# Patient Record
Sex: Female | Born: 1950 | Race: Black or African American | Hispanic: No | Marital: Married | Smoking: Former smoker
Health system: Southern US, Community
[De-identification: ages and names within clinical notes are randomized; demographics above are authoritative.]

## PROBLEM LIST (undated history)

## (undated) DIAGNOSIS — K219 Gastro-esophageal reflux disease without esophagitis: Secondary | ICD-10-CM

## (undated) DIAGNOSIS — H269 Unspecified cataract: Secondary | ICD-10-CM

## (undated) DIAGNOSIS — E559 Vitamin D deficiency, unspecified: Secondary | ICD-10-CM

## (undated) DIAGNOSIS — L439 Lichen planus, unspecified: Secondary | ICD-10-CM

## (undated) DIAGNOSIS — D126 Benign neoplasm of colon, unspecified: Secondary | ICD-10-CM

## (undated) DIAGNOSIS — M1990 Unspecified osteoarthritis, unspecified site: Secondary | ICD-10-CM

## (undated) DIAGNOSIS — J45909 Unspecified asthma, uncomplicated: Secondary | ICD-10-CM

## (undated) HISTORY — PX: COLONOSCOPY: SHX174

## (undated) HISTORY — PX: BREAST EXCISIONAL BIOPSY: SUR124

## (undated) HISTORY — DX: Unspecified cataract: H26.9

## (undated) HISTORY — DX: Lichen planus, unspecified: L43.9

## (undated) HISTORY — DX: Unspecified osteoarthritis, unspecified site: M19.90

## (undated) HISTORY — PX: POLYPECTOMY: SHX149

## (undated) HISTORY — DX: Vitamin D deficiency, unspecified: E55.9

## (undated) HISTORY — DX: Benign neoplasm of colon, unspecified: D12.6

## (undated) HISTORY — DX: Gastro-esophageal reflux disease without esophagitis: K21.9

## (undated) HISTORY — DX: Unspecified asthma, uncomplicated: J45.909

## (undated) HISTORY — PX: UPPER GASTROINTESTINAL ENDOSCOPY: SHX188

---

## 1992-01-15 HISTORY — PX: TUBAL LIGATION: SHX77

## 1998-06-08 ENCOUNTER — Other Ambulatory Visit: Admission: RE | Admit: 1998-06-08 | Discharge: 1998-06-08 | Payer: Self-pay | Admitting: Obstetrics

## 1999-01-15 HISTORY — PX: BREAST BIOPSY: SHX20

## 1999-02-26 ENCOUNTER — Encounter: Admission: RE | Admit: 1999-02-26 | Discharge: 1999-02-26 | Payer: Self-pay | Admitting: Obstetrics

## 1999-02-26 ENCOUNTER — Encounter: Payer: Self-pay | Admitting: Obstetrics

## 2000-07-08 ENCOUNTER — Encounter: Admission: RE | Admit: 2000-07-08 | Discharge: 2000-07-08 | Payer: Self-pay | Admitting: Obstetrics

## 2000-07-08 ENCOUNTER — Encounter: Payer: Self-pay | Admitting: Obstetrics

## 2000-07-14 ENCOUNTER — Encounter: Admission: RE | Admit: 2000-07-14 | Discharge: 2000-07-14 | Payer: Self-pay | Admitting: Obstetrics

## 2000-07-14 ENCOUNTER — Encounter: Payer: Self-pay | Admitting: Obstetrics

## 2003-03-23 ENCOUNTER — Ambulatory Visit (HOSPITAL_COMMUNITY): Admission: RE | Admit: 2003-03-23 | Discharge: 2003-03-23 | Payer: Self-pay | Admitting: Gastroenterology

## 2003-04-12 ENCOUNTER — Encounter: Admission: RE | Admit: 2003-04-12 | Discharge: 2003-04-12 | Payer: Self-pay | Admitting: Emergency Medicine

## 2003-04-26 ENCOUNTER — Encounter (INDEPENDENT_AMBULATORY_CARE_PROVIDER_SITE_OTHER): Payer: Self-pay | Admitting: *Deleted

## 2003-04-26 ENCOUNTER — Encounter: Admission: RE | Admit: 2003-04-26 | Discharge: 2003-04-26 | Payer: Self-pay | Admitting: Emergency Medicine

## 2003-05-23 ENCOUNTER — Ambulatory Visit (HOSPITAL_BASED_OUTPATIENT_CLINIC_OR_DEPARTMENT_OTHER): Admission: RE | Admit: 2003-05-23 | Discharge: 2003-05-23 | Payer: Self-pay | Admitting: General Surgery

## 2003-05-23 ENCOUNTER — Encounter: Admission: RE | Admit: 2003-05-23 | Discharge: 2003-05-23 | Payer: Self-pay | Admitting: General Surgery

## 2003-05-23 ENCOUNTER — Ambulatory Visit (HOSPITAL_COMMUNITY): Admission: RE | Admit: 2003-05-23 | Discharge: 2003-05-23 | Payer: Self-pay | Admitting: General Surgery

## 2003-05-23 ENCOUNTER — Encounter (INDEPENDENT_AMBULATORY_CARE_PROVIDER_SITE_OTHER): Payer: Self-pay | Admitting: *Deleted

## 2007-08-13 ENCOUNTER — Encounter: Admission: RE | Admit: 2007-08-13 | Discharge: 2007-08-13 | Payer: Self-pay | Admitting: Emergency Medicine

## 2007-08-14 ENCOUNTER — Encounter: Admission: RE | Admit: 2007-08-14 | Discharge: 2007-09-10 | Payer: Self-pay | Admitting: Emergency Medicine

## 2007-08-24 ENCOUNTER — Encounter: Admission: RE | Admit: 2007-08-24 | Discharge: 2007-08-24 | Payer: Self-pay | Admitting: Emergency Medicine

## 2007-09-18 ENCOUNTER — Encounter (HOSPITAL_BASED_OUTPATIENT_CLINIC_OR_DEPARTMENT_OTHER): Payer: Self-pay | Admitting: General Surgery

## 2007-09-18 ENCOUNTER — Ambulatory Visit (HOSPITAL_BASED_OUTPATIENT_CLINIC_OR_DEPARTMENT_OTHER): Admission: RE | Admit: 2007-09-18 | Discharge: 2007-09-18 | Payer: Self-pay | Admitting: General Surgery

## 2008-08-16 ENCOUNTER — Encounter: Admission: RE | Admit: 2008-08-16 | Discharge: 2008-08-16 | Payer: Self-pay | Admitting: Emergency Medicine

## 2008-09-15 ENCOUNTER — Encounter: Admission: RE | Admit: 2008-09-15 | Discharge: 2008-09-15 | Payer: Self-pay | Admitting: Emergency Medicine

## 2009-01-14 HISTORY — PX: BUNIONECTOMY: SHX129

## 2009-09-26 ENCOUNTER — Encounter: Admission: RE | Admit: 2009-09-26 | Discharge: 2009-09-26 | Payer: Self-pay | Admitting: Emergency Medicine

## 2010-01-14 HISTORY — PX: BUNIONECTOMY: SHX129

## 2010-05-29 NOTE — Op Note (Signed)
NAMELEENAH, SEIDNER NO.:  [id number]   MEDICAL RECORD NO.:  [id number]          PATIENT TYPE:  AMB   LOCATION:  DSC                          FACILITY:  [facility]   PHYSICIAN:  Leonie Man, M.D.   DATE OF BIRTH:  09-29-1950   DATE OF PROCEDURE:  09/18/2007  DATE OF DISCHARGE:                               OPERATIVE REPORT   PREOPERATIVE DIAGNOSIS:  Mass, left medial leg at the knee.   POSTOPERATIVE DIAGNOSIS:  Mass, left medial leg at the knee.   PATHOLOGY:  Pending.   PROCEDURE:  Excision of mass, left medial leg at the knee.   SURGEON:  Leonie Man, MD   ASSISTANT:  OR technician.   ANESTHESIA:  General.   SPECIMENS TO LABORATORY:  Mass, left leg forwarded to Pathology.   ESTIMATED BLOOD LOSS:  Minimal.   COMPLICATIONS:  None.   DISPOSITION:  The patient returned the PACU in excellent condition.   INDICATIONS:  The patient is a 60 year old woman with a rather tender 2-  cm mass in the medial aspect of her left leg just below the knee.  This  mass has not been increasing in size, but is becoming increasingly  painful for the patient.  Just touching the mass will trigger  significant pain within the mass.  She comes to the operating room now  for excision of this mass on general anesthesia.   PROCEDURE:  The patient was positioned supine and following the  induction of satisfactory general anesthesia, the left leg was prepped  and draped to be included in the sterile operative field.  I infiltrated  the region around the mass with 0.5% Marcaine with epinephrine.  I made  an elliptical incision around the mass deepening this through skin into  the subcutaneous tissue and dissected the subcutaneous mass from the  surrounding tissues in its entirety, removed it and forwarded it for  pathologic evaluation.  Hemostasis was assured with electrocautery.  The  subcutaneous tissues were reapproximated with 3-0 Vicryl.  Skin was  closed with 4-0 Monocryl  and reinforced with Steri-Strips.  Sterile  dressings were applied.  The anesthetic was reversed.  The patient was  moved from the operating room to the recovery room in stable condition.  She tolerated the procedure well.      Leonie Man, M.D.  Electronically Signed     PB/MEDQ  D:  09/18/2007  T:  09/18/2007  Job:  [id number]

## 2010-10-14 IMAGING — US US TRANSVAGINAL NON-OB
1 series · 14 of 25 positions shown · non-contrast
Comparison: None

CLINICAL DATA: Abnormal Pap smear

TRANSABDOMINAL AND TRANSVAGINAL ULTRASOUND OF PELVIS
TECHNIQUE: Both transabdominal and transvaginal ultrasound
examinations of the pelvis were performed including evaluation of
the uterus, ovaries, adnexal regions, and pelvic cul-de-sac.

[Series 1: us transvaginal non-ob · 0.25mm/px · 14 of 48 slices shown]
[im 1/48]
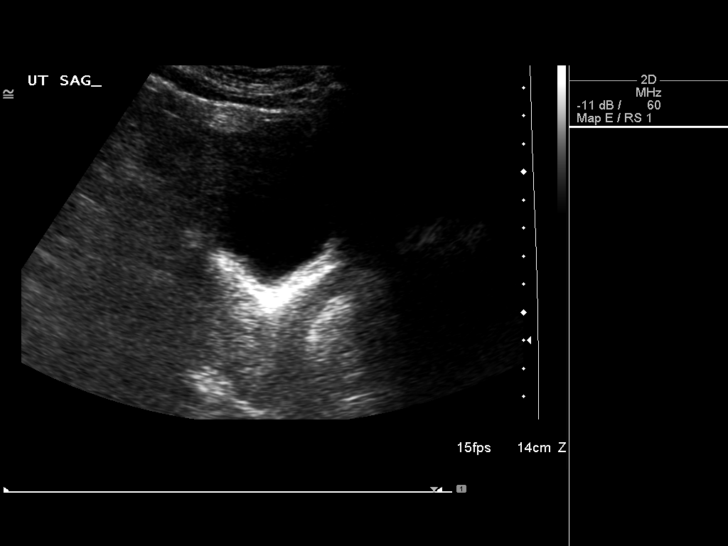
[im 4/48]
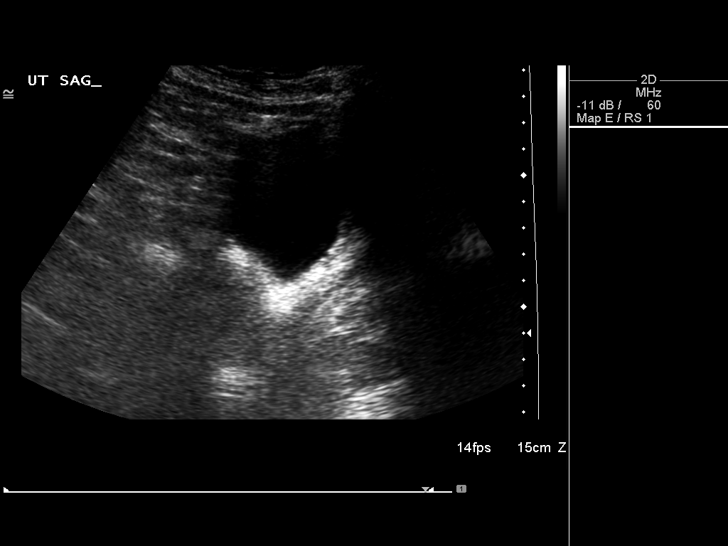
[im 8/48]
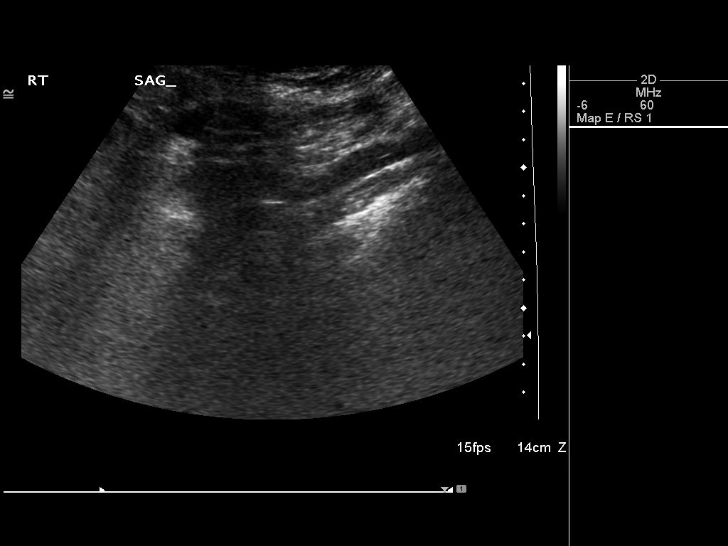
[im 12/48]
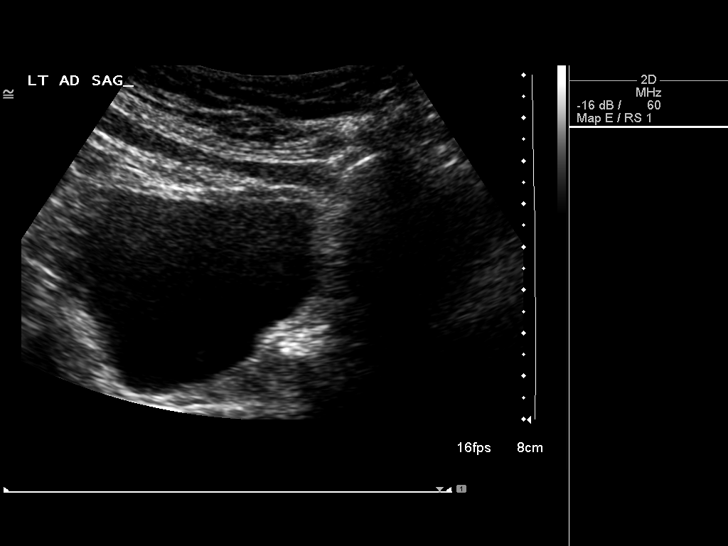
[im 16/48]
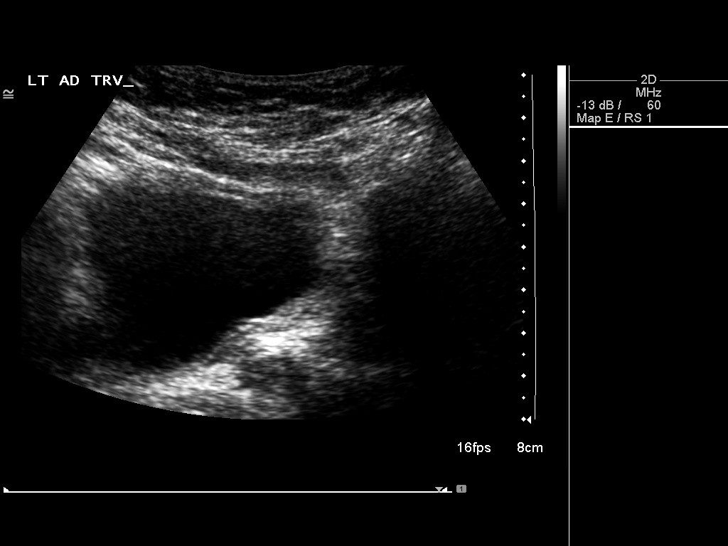
[im 18/48]
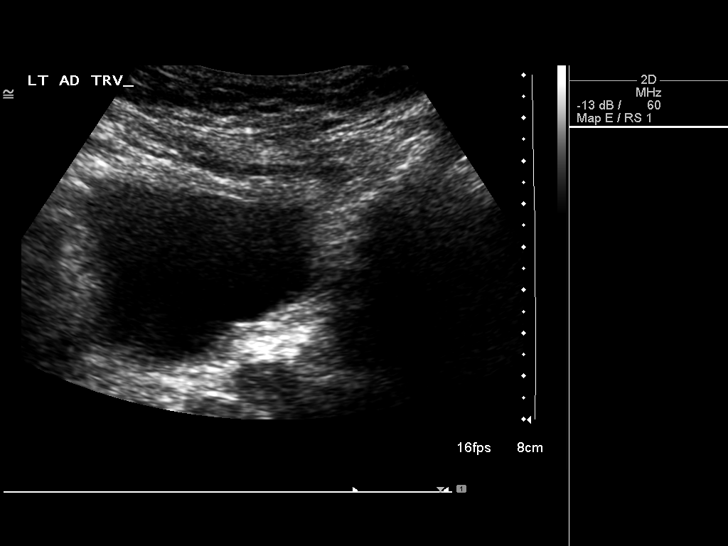
[im 22/48]
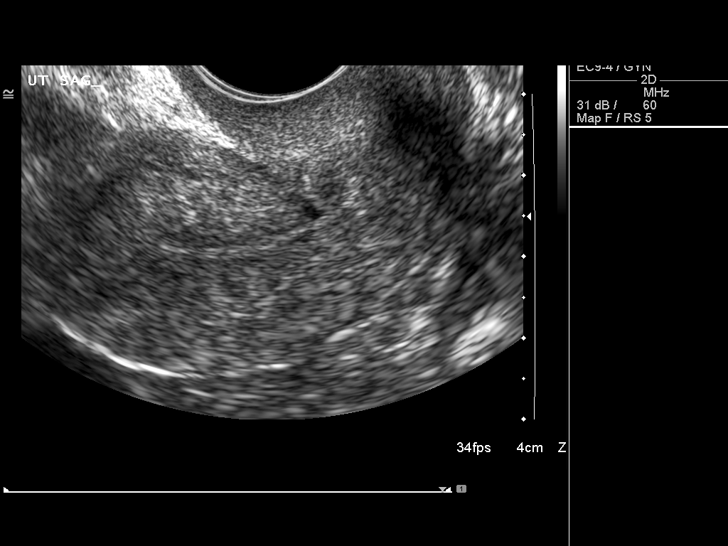
[im 26/48]
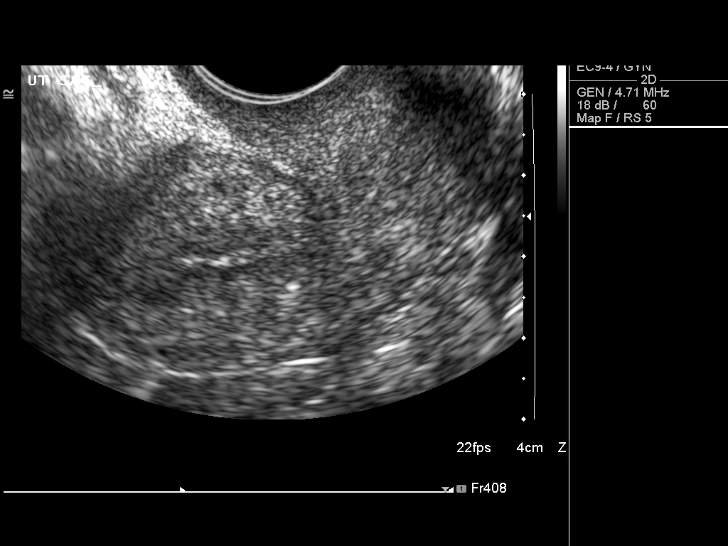
[im 30/48]
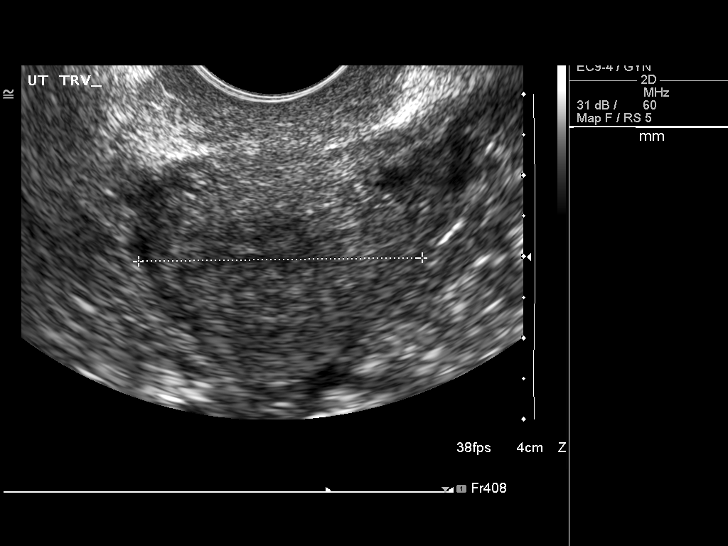
[im 32/48]
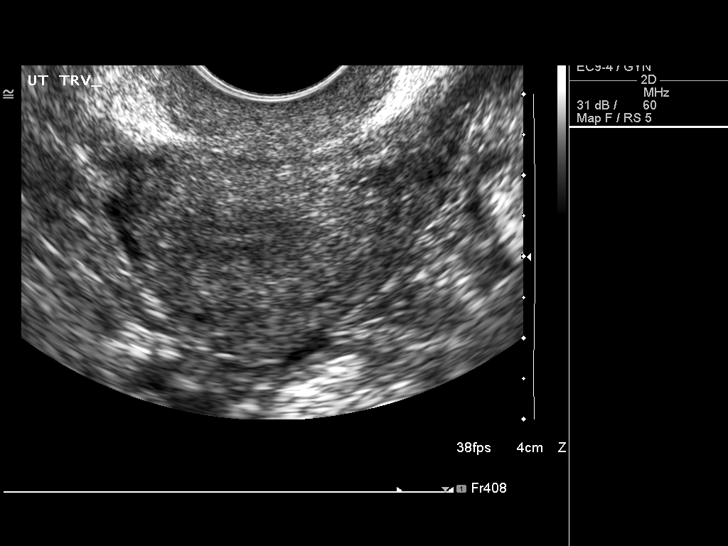
[im 36/48]
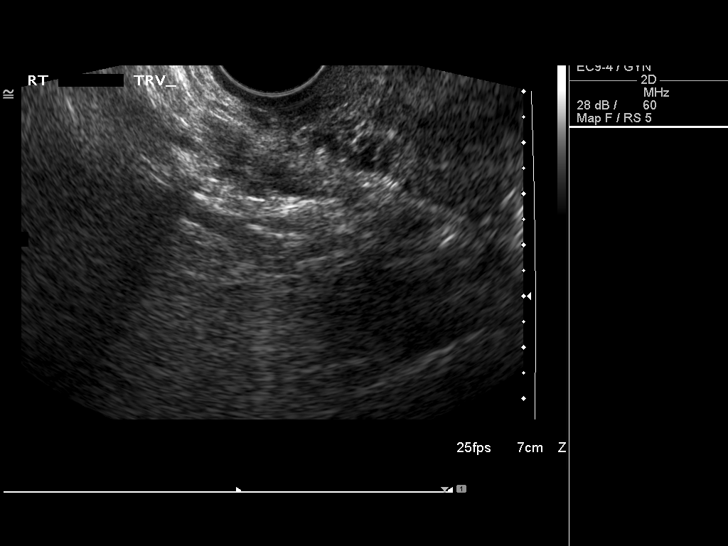
[im 40/48]
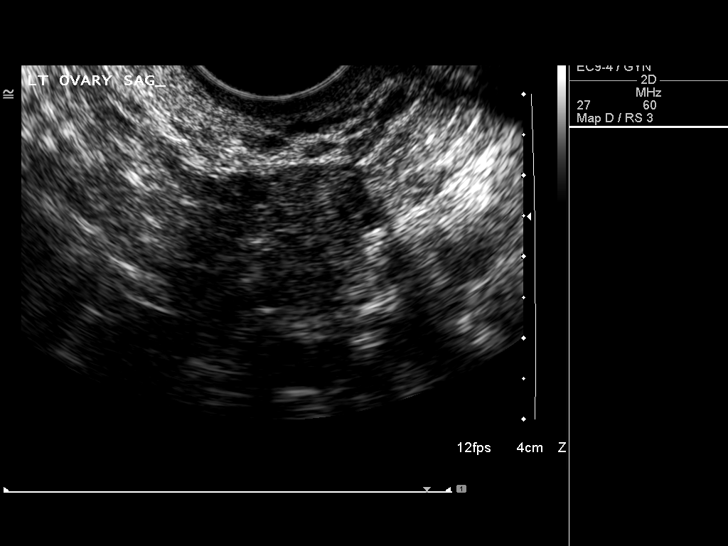
[im 44/48]
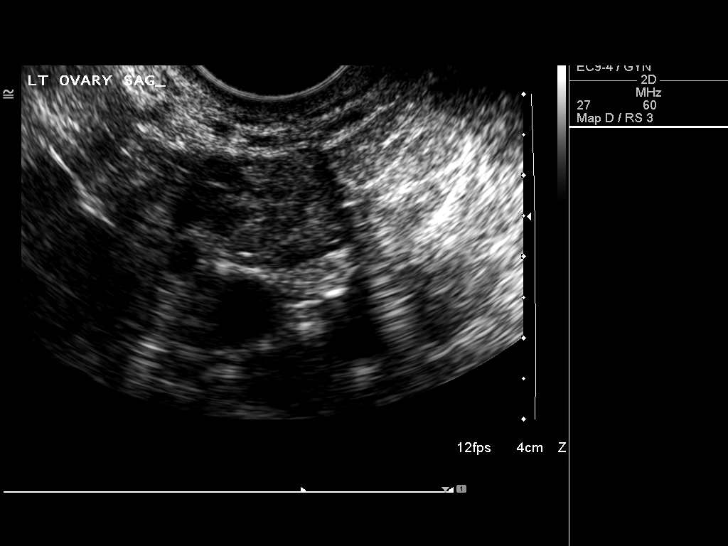
[im 48/48]
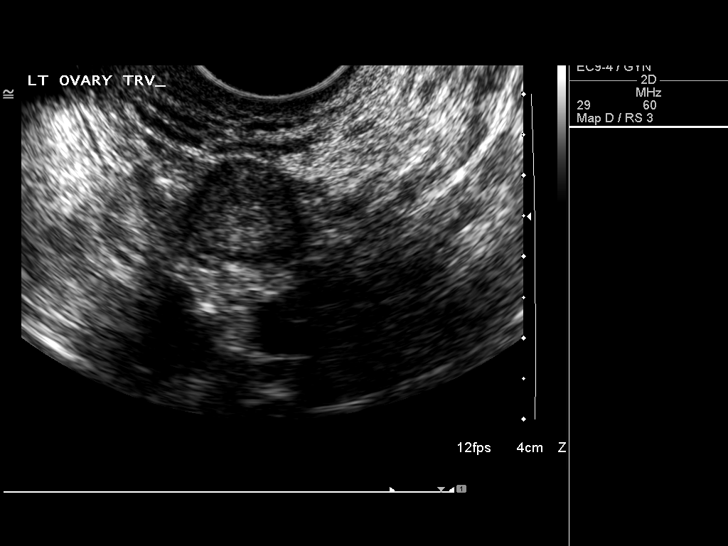

[14 of 25 positions shown; findings below may reference images not displayed]

FINDINGS: Uterus measures 5.5 x 3.1 x 3.5 cm.  No myometrial abnormality is
seen.

Endometrium measures 1.2 mm in thickness.  No abnormality is noted.

Right Ovary is not visualized.

Left Ovary measures 2.5 x 1.7 x 2.1 cm.

Other Findings:  No free fluid is seen.
IMPRESSION: Negative pelvic ultrasound.  The right ovary however is not
visualized.

## 2010-10-17 LAB — POCT HEMOGLOBIN-HEMACUE: Hemoglobin: 11.5 — ABNORMAL LOW

## 2011-01-18 ENCOUNTER — Other Ambulatory Visit: Payer: Self-pay | Admitting: Emergency Medicine

## 2011-01-18 DIAGNOSIS — Z1231 Encounter for screening mammogram for malignant neoplasm of breast: Secondary | ICD-10-CM

## 2011-02-04 ENCOUNTER — Ambulatory Visit
Admission: RE | Admit: 2011-02-04 | Discharge: 2011-02-04 | Disposition: A | Discharge status: Home / Self Care | Payer: BC Managed Care – PPO | Source: Ambulatory Visit | Attending: Emergency Medicine | Admitting: Emergency Medicine

## 2011-02-04 DIAGNOSIS — Z1231 Encounter for screening mammogram for malignant neoplasm of breast: Secondary | ICD-10-CM

## 2011-03-18 ENCOUNTER — Other Ambulatory Visit: Payer: Self-pay | Admitting: Family Medicine

## 2011-03-18 ENCOUNTER — Ambulatory Visit
Admission: RE | Admit: 2011-03-18 | Discharge: 2011-03-18 | Disposition: A | Discharge status: Home / Self Care | Payer: BC Managed Care – PPO | Source: Ambulatory Visit | Attending: Family Medicine | Admitting: Family Medicine

## 2011-03-18 DIAGNOSIS — R05 Cough: Secondary | ICD-10-CM

## 2011-05-29 ENCOUNTER — Other Ambulatory Visit: Payer: Self-pay | Admitting: Family Medicine

## 2011-06-05 ENCOUNTER — Encounter: Payer: Self-pay | Admitting: Gastroenterology

## 2011-06-12 LAB — POC HEMOCCULT BLD/STL (HOME/3-CARD/SCREEN)

## 2011-07-22 ENCOUNTER — Telehealth: Payer: Self-pay | Admitting: *Deleted

## 2011-07-22 ENCOUNTER — Ambulatory Visit (AMBULATORY_SURGERY_CENTER): Payer: BC Managed Care – PPO | Admitting: *Deleted

## 2011-07-22 ENCOUNTER — Encounter: Payer: Self-pay | Admitting: Gastroenterology

## 2011-07-22 VITALS — Ht 62.0 in | Wt 200.9 lb

## 2011-07-22 DIAGNOSIS — D509 Iron deficiency anemia, unspecified: Secondary | ICD-10-CM

## 2011-07-22 MED ORDER — MOVIPREP 100 G PO SOLR: ORAL | Status: DC

## 2011-07-22 NOTE — Telephone Encounter (Signed)
Direct colonoscopy ok for fe def anemia since colon was so long ago. I will review records later this week.

## 2011-07-22 NOTE — Telephone Encounter (Signed)
Pt says last colonoscopy was 6 years ago with Dr Loreta Ave.  Pt referred by Dr. Virgina Organ for possible iron deficiency anemia.  Release of information form signed and given to Christie Nottingham.  Colonoscopy scheduled for Friday 8-2 with Dr. Russella Dar.

## 2011-07-22 NOTE — Telephone Encounter (Signed)
Dr. Russella Dar:  Pt here for PV today- scheduled for colonoscopy 8/2.  Pt says last colonoscopy 6 years ago with Dr. Loreta Ave.  Dr. Duaine Dredge is requesting colonoscopy for iron deficiency anemia.  We have lab tests from Dr. Duaine Dredge and records from Dr. Loreta Ave have been requested.  Marchelle Folks will put lab results and records from previous colonoscopy with Dr. Loreta Ave on your desk for review when she receives Dr. Kenna Gilbert records. Please advise if pt is okay for direct colonoscopy after reviewing labs and records.  Thanks

## 2011-08-01 ENCOUNTER — Encounter: Payer: BC Managed Care – PPO | Admitting: Gastroenterology

## 2011-08-16 ENCOUNTER — Encounter: Payer: BC Managed Care – PPO | Admitting: Gastroenterology

## 2011-09-10 ENCOUNTER — Encounter: Payer: BC Managed Care – PPO | Admitting: Gastroenterology

## 2012-03-17 ENCOUNTER — Other Ambulatory Visit: Payer: Self-pay

## 2012-03-17 DIAGNOSIS — Z1231 Encounter for screening mammogram for malignant neoplasm of breast: Secondary | ICD-10-CM

## 2012-04-13 ENCOUNTER — Ambulatory Visit
Admission: RE | Admit: 2012-04-13 | Discharge: 2012-04-13 | Disposition: A | Discharge status: Home / Self Care | Payer: BC Managed Care – PPO | Source: Ambulatory Visit

## 2012-04-13 DIAGNOSIS — Z1231 Encounter for screening mammogram for malignant neoplasm of breast: Secondary | ICD-10-CM

## 2013-04-27 ENCOUNTER — Other Ambulatory Visit: Payer: Self-pay

## 2013-04-27 DIAGNOSIS — Z1231 Encounter for screening mammogram for malignant neoplasm of breast: Secondary | ICD-10-CM

## 2013-05-13 ENCOUNTER — Encounter (INDEPENDENT_AMBULATORY_CARE_PROVIDER_SITE_OTHER): Payer: Self-pay

## 2013-05-13 ENCOUNTER — Ambulatory Visit
Admission: RE | Admit: 2013-05-13 | Discharge: 2013-05-13 | Disposition: A | Discharge status: Home / Self Care | Payer: BC Managed Care – PPO | Source: Ambulatory Visit

## 2013-05-13 DIAGNOSIS — Z1231 Encounter for screening mammogram for malignant neoplasm of breast: Secondary | ICD-10-CM

## 2013-08-26 ENCOUNTER — Encounter: Payer: Self-pay | Admitting: Gastroenterology

## 2013-10-14 DIAGNOSIS — D126 Benign neoplasm of colon, unspecified: Secondary | ICD-10-CM

## 2013-10-14 HISTORY — DX: Benign neoplasm of colon, unspecified: D12.6

## 2013-10-20 ENCOUNTER — Ambulatory Visit (AMBULATORY_SURGERY_CENTER): Payer: Self-pay | Admitting: *Deleted

## 2013-10-20 VITALS — Ht 62.0 in | Wt 190.8 lb

## 2013-10-20 DIAGNOSIS — Z1211 Encounter for screening for malignant neoplasm of colon: Secondary | ICD-10-CM

## 2013-10-20 MED ORDER — MOVIPREP 100 G PO SOLR: ORAL | Status: DC

## 2013-10-20 NOTE — Progress Notes (Signed)
No allergies to eggs or soy. No problems with anesthesia.  Pt given Emmi instructions for colonoscopy  No oxygen use  No diet drug use  

## 2013-10-28 ENCOUNTER — Encounter: Payer: Self-pay | Admitting: Gastroenterology

## 2013-10-29 ENCOUNTER — Telehealth: Payer: Self-pay | Admitting: Gastroenterology

## 2013-10-31 NOTE — Telephone Encounter (Signed)
Yes, charge. 

## 2013-11-02 ENCOUNTER — Encounter: Payer: BC Managed Care – PPO | Admitting: Gastroenterology

## 2013-11-11 ENCOUNTER — Encounter: Payer: Self-pay | Admitting: Gastroenterology

## 2013-11-11 ENCOUNTER — Ambulatory Visit (AMBULATORY_SURGERY_CENTER): Payer: BC Managed Care – PPO | Admitting: Gastroenterology

## 2013-11-11 VITALS — BP 123/78 | HR 51 | Temp 97.4°F | Resp 26 | Ht 62.0 in | Wt 190.0 lb

## 2013-11-11 DIAGNOSIS — Z1211 Encounter for screening for malignant neoplasm of colon: Secondary | ICD-10-CM

## 2013-11-11 DIAGNOSIS — D120 Benign neoplasm of cecum: Secondary | ICD-10-CM

## 2013-11-11 MED ORDER — SODIUM CHLORIDE 0.9 % IV SOLN: 500.0000 mL | INTRAVENOUS | Status: DC

## 2013-11-11 NOTE — Patient Instructions (Addendum)
YOU HAD AN ENDOSCOPIC PROCEDURE TODAY AT THE [facility]: Refer to the procedure report that was given to you for any specific questions about what was found during the examination.  If the procedure report does not answer your questions, please call your gastroenterologist to clarify.  If you requested that your care partner not be given the details of your procedure findings, then the procedure report has been included in a sealed envelope for you to review at your convenience later.  YOU SHOULD EXPECT: Some feelings of bloating in the abdomen. Passage of more gas than usual.  Walking can help get rid of the air that was put into your GI tract during the procedure and reduce the bloating. If you had a lower endoscopy (such as a colonoscopy or flexible sigmoidoscopy) you may notice spotting of blood in your stool or on the toilet paper. If you underwent a bowel prep for your procedure, then you may not have a normal bowel movement for a few days.  DIET: Your first meal following the procedure should be a light meal and then it is ok to progress to your normal diet.  A half-sandwich or bowl of soup is an example of a good first meal.  Heavy or fried foods are harder to digest and may make you feel nauseous or bloated.  Likewise meals heavy in dairy and vegetables can cause extra gas to form and this can also increase the bloating.  Drink plenty of fluids but you should avoid alcoholic beverages for 24 hours.  ACTIVITY: Your care partner should take you home directly after the procedure.  You should plan to take it easy, moving slowly for the rest of the day.  You can resume normal activity the day after the procedure however you should NOT DRIVE or use heavy machinery for 24 hours (because of the sedation medicines used during the test).    SYMPTOMS TO REPORT IMMEDIATELY: A gastroenterologist can be reached at any hour.  During normal business hours, 8:30 AM to 5:00 PM Monday through Friday,  call [phone].  After hours and on weekends, please call the GI answering service at [phone] who will take a message and have the physician on call contact you.   Following lower endoscopy (colonoscopy or flexible sigmoidoscopy):  Excessive amounts of blood in the stool  Significant tenderness or worsening of abdominal pains  Swelling of the abdomen that is new, acute  Fever of 100F or higher  FOLLOW UP: If any biopsies were taken you will be contacted by phone or by letter within the next 1-3 weeks.  Call your gastroenterologist if you have not heard about the biopsies in 3 weeks.  Our staff will call the home number listed on your records the next business day following your procedure to check on you and address any questions or concerns that you may have at that time regarding the information given to you following your procedure. This is a courtesy call and so if there is no answer at the home number and we have not heard from you through the emergency physician on call, we will assume that you have returned to your regular daily activities without incident.  SIGNATURES/CONFIDENTIALITY: You and/or your care partner have signed paperwork which will be entered into your electronic medical record.  These signatures attest to the fact that that the information above on your After Visit Summary has been reviewed and is understood.  Full responsibility of the confidentiality of this   information lies with you and/or your care-partner.    Handout was given to your care partner on polyps. You may resume your current medications today. Await biopsy results. Please call if any questions or concerns.   

## 2013-11-11 NOTE — Progress Notes (Signed)
Called to room to assist during endoscopic procedure.  Patient ID and intended procedure confirmed with present staff. Received instructions for my participation in the procedure from the performing physician.  

## 2013-11-11 NOTE — Progress Notes (Signed)
No problems noted in the recovery room. maw 

## 2013-11-11 NOTE — Op Note (Signed)
[place]  [address] [place], [id number]   COLONOSCOPY PROCEDURE REPORT  PATIENT: Shelia Wade, Shelia Wade  MR#: [id number] BIRTHDATE: 07-27-1950 , 63  yrs. old GENDER: female ENDOSCOPIST: Ladene Artist, MD, [facility] REFERRED NT:IRWER Sandi Mariscal, M.D. PROCEDURE DATE:  11/11/2013 PROCEDURE:   Colonoscopy with snare polypectomy First Screening Colonoscopy - Avg.  risk and is 50 yrs.  old or older - No.  Prior Negative Screening - Now for repeat screening. 10 or more years since last screening  History of Adenoma - Now for follow-up colonoscopy & has been > or = to 3 yrs.  N/A  Polyps Removed Today? Yes. ASA CLASS:   Class II INDICATIONS:average risk for colorectal cancer. MEDICATIONS: Monitored anesthesia care and Propofol 200 mg IV DESCRIPTION OF PROCEDURE:   After the risks benefits and alternatives of the procedure were thoroughly explained, informed consent was obtained.  The digital rectal exam revealed no abnormalities of the rectum.   The [id number]  endoscope was introduced through the anus and advanced to the cecum, which was identified by both the appendix and ileocecal valve. No adverse events experienced.   The quality of the prep was excellent, using MoviPrep  The instrument was then slowly withdrawn as the colon was fully examined.   COLON FINDINGS: A sessile polyp measuring 6 mm in size was found at the ileocecal valve.  A polypectomy was performed with a cold snare.  The resection was complete, the polyp tissue was completely retrieved and sent to histology.   The examination was otherwise normal.  Retroflexed views revealed no abnormalities. The time to cecum=4 minutes 32 seconds.  Withdrawal time=13 minutes 13 seconds. The scope was withdrawn and the procedure completed.  COMPLICATIONS: There were no immediate complications.  ENDOSCOPIC IMPRESSION: 1.   Sessile polyp at the ileocecal valve; polypectomy performed with a cold  snare 2.   The examination was otherwise normal  RECOMMENDATIONS: 1.  Await pathology results 2.  Repeat colonoscopy in 5 years if polyp adenomatous; otherwise 10 years  eSigned:  Ladene Artist, MD, [facility] 11/11/2013 10:59 AM

## 2013-11-11 NOTE — Progress Notes (Signed)
A/ox3 pleased with MAC, report to Annette RN 

## 2013-11-12 ENCOUNTER — Telehealth: Payer: Self-pay | Admitting: *Deleted

## 2013-11-12 NOTE — Telephone Encounter (Signed)
  Follow up Call-  Call back number 11/11/2013  Post procedure Call Back phone  # [phone]  Permission to leave phone message Yes     Patient questions:  Do you have a fever, pain , or abdominal swelling? No. Pain Score  0 *  Have you tolerated food without any problems? Yes.    Have you been able to return to your normal activities? Yes.    Do you have any questions about your discharge instructions: Diet   No. Medications  No. Follow up visit  No.  Do you have questions or concerns about your Care? No.  Actions: * If pain score is 4 or above: No action needed, pain <4.

## 2013-11-17 ENCOUNTER — Encounter: Payer: Self-pay | Admitting: Gastroenterology

## 2014-08-15 ENCOUNTER — Other Ambulatory Visit: Payer: Self-pay

## 2014-08-15 DIAGNOSIS — Z1231 Encounter for screening mammogram for malignant neoplasm of breast: Secondary | ICD-10-CM

## 2014-09-21 ENCOUNTER — Ambulatory Visit
Admission: RE | Admit: 2014-09-21 | Discharge: 2014-09-21 | Disposition: A | Discharge status: Home / Self Care | Payer: BC Managed Care – PPO | Source: Ambulatory Visit

## 2014-09-21 DIAGNOSIS — Z1231 Encounter for screening mammogram for malignant neoplasm of breast: Secondary | ICD-10-CM

## 2014-10-27 ENCOUNTER — Other Ambulatory Visit: Payer: Self-pay | Admitting: Family Medicine

## 2014-10-27 DIAGNOSIS — N6320 Unspecified lump in the left breast, unspecified quadrant: Secondary | ICD-10-CM

## 2014-11-01 ENCOUNTER — Encounter: Payer: Self-pay | Admitting: Gastroenterology

## 2014-11-03 ENCOUNTER — Ambulatory Visit
Admission: RE | Admit: 2014-11-03 | Discharge: 2014-11-03 | Disposition: A | Discharge status: Home / Self Care | Payer: BC Managed Care – PPO | Source: Ambulatory Visit | Attending: Family Medicine | Admitting: Family Medicine

## 2014-11-03 DIAGNOSIS — N6320 Unspecified lump in the left breast, unspecified quadrant: Secondary | ICD-10-CM

## 2015-01-03 ENCOUNTER — Ambulatory Visit (INDEPENDENT_AMBULATORY_CARE_PROVIDER_SITE_OTHER): Payer: BC Managed Care – PPO | Admitting: Gastroenterology

## 2015-01-03 ENCOUNTER — Other Ambulatory Visit (INDEPENDENT_AMBULATORY_CARE_PROVIDER_SITE_OTHER): Payer: BC Managed Care – PPO

## 2015-01-03 ENCOUNTER — Encounter: Payer: Self-pay | Admitting: Gastroenterology

## 2015-01-03 VITALS — BP 130/80 | HR 72 | Ht 62.0 in | Wt 181.0 lb

## 2015-01-03 DIAGNOSIS — Z86010 Personal history of colonic polyps: Secondary | ICD-10-CM

## 2015-01-03 DIAGNOSIS — D509 Iron deficiency anemia, unspecified: Secondary | ICD-10-CM | POA: Diagnosis not present

## 2015-01-03 LAB — IGA: IGA: 396 mg/dL — AB (ref 68–378)

## 2015-01-03 NOTE — Progress Notes (Signed)
    History of Present Illness: This is a 64 year old female referred by Dr. Sandi Mariscal for evaluation of iron deficiency anemia. Blood work at a routine physical exam revealed a hemoglobin of 10.8 with a low MCV. Iron saturation was 10%. Stool Hemoccult testing was negative. She relates very infrequent mild episodes of heartburn but had no other gastrointestinal complaints. She underwent colonoscopy in October 2015 with an excellent bowel prep showing only a 6 mm adenomatous colon polyp. Denies weight loss, abdominal pain, constipation, diarrhea, change in stool caliber, melena, hematochezia, nausea, vomiting, dysphagia, chest pain.  Current Medications, Allergies, Past Medical History, Past Surgical History, Family History and Social History were reviewed in [employer] record.  Physical Exam: General: Well developed, well nourished, no acute distress Head: Normocephalic and atraumatic Eyes:  sclerae anicteric, EOMI Ears: Normal auditory acuity Mouth: No deformity or lesions Lungs: Clear throughout to auscultation Heart: Regular rate and rhythm; no murmurs, rubs or bruits Abdomen: Soft, non tender and non distended. No masses, hepatosplenomegaly or hernias noted. Normal Bowel sounds Musculoskeletal: Symmetrical with no gross deformities  Pulses:  Normal pulses noted Extremities: No clubbing, cyanosis, edema or deformities noted Neurological: Alert oriented x 4, grossly nonfocal Psychological:  Alert and cooperative. Normal mood and affect  Assessment and Recommendations:  1. Iron deficiency anemia with hemoccult negative stool. Infrequent heartburn. R/O occult GI losses, poor iron absorption, celiac disease. Check tTG and IgA. Fe po daily with a meal. Colonoscopy last year had an excellent prep and showed 1 small adenomatous polyp. EGD to further evaluate for potential UGI sources of blood loss. The risks (including bleeding, perforation, infection, missed lesions,  medication reactions and possible hospitalization or surgery if complications occur), benefits, and alternatives to endoscopy with possible biopsy and possible dilation were discussed with the patient and they consent to proceed.   2. Personal history of an adenomatous colon polyp. 5 year interval surveillance colonoscopy recommended in October 2020. Marland Kitchen

## 2015-01-03 NOTE — Patient Instructions (Addendum)
Your physician has requested that you go to the basement for the following lab work before leaving today:TTG, IGA  You have been scheduled for an endoscopy. Please follow written instructions given to you at your visit today. If you use inhalers (even only as needed), please bring them with you on the day of your procedure. Your physician has requested that you go to www.startemmi.com and enter the access code given to you at your visit today. This web site gives a general overview about your procedure. However, you should still follow specific instructions given to you by our office regarding your preparation for the procedure.  Thank you for choosing me and [facility].  [name]. Dagoberto Ligas., MD., Marval Regal   cc: Derinda Late, MD

## 2015-01-04 LAB — TISSUE TRANSGLUTAMINASE, IGA: Tissue Transglutaminase Ab, IgA: 1 U/mL (ref <4)

## 2015-01-06 ENCOUNTER — Encounter: Payer: Self-pay | Admitting: Gastroenterology

## 2015-01-06 ENCOUNTER — Ambulatory Visit (AMBULATORY_SURGERY_CENTER): Payer: BC Managed Care – PPO | Admitting: Gastroenterology

## 2015-01-06 VITALS — BP 104/59 | HR 56 | Temp 96.9°F | Resp 15 | Ht 62.0 in | Wt 181.0 lb

## 2015-01-06 DIAGNOSIS — K2970 Gastritis, unspecified, without bleeding: Secondary | ICD-10-CM

## 2015-01-06 DIAGNOSIS — K2950 Unspecified chronic gastritis without bleeding: Secondary | ICD-10-CM | POA: Diagnosis not present

## 2015-01-06 DIAGNOSIS — D509 Iron deficiency anemia, unspecified: Secondary | ICD-10-CM | POA: Diagnosis not present

## 2015-01-06 DIAGNOSIS — B9681 Helicobacter pylori [H. pylori] as the cause of diseases classified elsewhere: Secondary | ICD-10-CM | POA: Diagnosis not present

## 2015-01-06 MED ORDER — SODIUM CHLORIDE 0.9 % IV SOLN: 500.0000 mL | INTRAVENOUS | Status: DC

## 2015-01-06 MED ORDER — OMEPRAZOLE 20 MG PO CPDR: 20.0000 mg | DELAYED_RELEASE_CAPSULE | Freq: Every day | ORAL | Status: DC

## 2015-01-06 NOTE — Op Note (Signed)
[place]  [address] [place], [id number]   ENDOSCOPY PROCEDURE REPORT  PATIENT: Shelia Wade, Shelia Wade  MR#: [id number] BIRTHDATE: 04/15/1950 , 64  yrs. old GENDER: female ENDOSCOPIST: Ladene Artist, MD, [facility] REFERRED BY:  Derinda Late, M.D. PROCEDURE DATE:  01/06/2015 PROCEDURE:  EGD w/ biopsy ASA CLASS:     Class II INDICATIONS:  iron deficiency anemia. MEDICATIONS: Monitored anesthesia care and Propofol 200 mg IV TOPICAL ANESTHETIC: none DESCRIPTION OF PROCEDURE: After the risks benefits and alternatives of the procedure were thoroughly explained, informed consent was obtained.  The [id number] endoscope was introduced through the mouth and advanced to the second portion of the duodenum , Without limitations.  The instrument was slowly withdrawn as the mucosa was fully examined.    STOMACH:  Mild nonerosive gastritis was found in the gastric body. Multiple biopsies were performed.   The stomach otherwise appeared normal. ESOPHAGUS: The mucosa of the esophagus appeared normal. DUODENUM: The duodenal mucosa showed no abnormalities in the bulb and 2nd part of the duodenum.  Retroflexed views revealed no abnormalities.   The scope was then withdrawn from the patient and the procedure completed.  COMPLICATIONS: There were no immediate complications.  ENDOSCOPIC IMPRESSION: 1.   Mild gastritis in the gastric body; multiple biopsies performed  2.   The EGD otherwise appeared normal  RECOMMENDATIONS: 1.  Await pathology results 2.  PPI qam for 2 months: omeprazole 20 mg po qam 3.  Continue Fe daily 4.  Follow up with Dr. Sandi Mariscal  eSigned:  Ladene Artist, MD, [facility] 01/06/2015 9:49 AM

## 2015-01-06 NOTE — Progress Notes (Signed)
To recovery, report to Myers, RN, VSS. 

## 2015-01-06 NOTE — Patient Instructions (Addendum)
YOU HAD AN ENDOSCOPIC PROCEDURE TODAY AT [facility]:   Refer to the procedure report that was given to you for any specific questions about what was found during the examination.  If the procedure report does not answer your questions, please call your gastroenterologist to clarify.  If you requested that your care partner not be given the details of your procedure findings, then the procedure report has been included in a sealed envelope for you to review at your convenience later.  YOU SHOULD EXPECT: Some feelings of bloating in the abdomen. Passage of more gas than usual.  Walking can help get rid of the air that was put into your GI tract during the procedure and reduce the bloating. If you had a lower endoscopy (such as a colonoscopy or flexible sigmoidoscopy) you may notice spotting of blood in your stool or on the toilet paper. If you underwent a bowel prep for your procedure, you may not have a normal bowel movement for a few days.  Please Note:  You might notice some irritation and congestion in your nose or some drainage.  This is from the oxygen used during your procedure.  There is no need for concern and it should clear up in a day or so.  SYMPTOMS TO REPORT IMMEDIATELY:   Following upper endoscopy (EGD)  Vomiting of blood or coffee ground material  New chest pain or pain under the shoulder blades  Painful or persistently difficult swallowing  New shortness of breath  Fever of 100F or higher  Black, tarry-looking stools  For urgent or emergent issues, a gastroenterologist can be reached at any hour by calling [phone].   DIET: Your first meal following the procedure should be a small meal and then it is ok to progress to your normal diet. Heavy or fried foods are harder to digest and may make you feel nauseous or bloated.  Likewise, meals heavy in dairy and vegetables can increase bloating.  Drink plenty of fluids but you should avoid alcoholic beverages for  24 hours.  ACTIVITY:  You should plan to take it easy for the rest of today and you should NOT DRIVE or use heavy machinery until tomorrow (because of the sedation medicines used during the test).    FOLLOW UP: Our staff will call the number listed on your records the next business day following your procedure to check on you and address any questions or concerns that you may have regarding the information given to you following your procedure. If we do not reach you, we will leave a message.  However, if you are feeling well and you are not experiencing any problems, there is no need to return our call.  We will assume that you have returned to your regular daily activities without incident.  If any biopsies were taken you will be contacted by phone or by letter within the next 1-3 weeks.  Please call us at [phone] if you have not heard about the biopsies in 3 weeks.    SIGNATURES/CONFIDENTIALITY: You and/or your care partner have signed paperwork which will be entered into your electronic medical record.  These signatures attest to the fact that that the information above on your After Visit Summary has been reviewed and is understood.  Full responsibility of the confidentiality of this discharge information lies with you and/or your care-partner.  Please review gastritis handout provided. Omeprazole every morning daily. Continue Iron Follow up with Dr. Sandi Mariscal

## 2015-01-06 NOTE — Progress Notes (Signed)
Called to room to assist during endoscopic procedure.  Patient ID and intended procedure confirmed with present staff. Received instructions for my participation in the procedure from the performing physician.  

## 2015-01-10 ENCOUNTER — Telehealth: Payer: Self-pay | Admitting: *Deleted

## 2015-01-10 NOTE — Telephone Encounter (Signed)
  Follow up Call-  Call back number 01/06/2015 11/11/2013  Post procedure Call Back phone  # [phone]  Permission to leave phone message Yes Yes    LMOM

## 2015-01-19 ENCOUNTER — Other Ambulatory Visit: Payer: Self-pay

## 2015-01-19 MED ORDER — BIS SUBCIT-METRONID-TETRACYC 140-125-125 MG PO CAPS: 3.0000 | ORAL_CAPSULE | Freq: Three times a day (TID) | ORAL | Status: DC

## 2015-02-06 ENCOUNTER — Telehealth: Payer: Self-pay | Admitting: Gastroenterology

## 2015-02-06 NOTE — Telephone Encounter (Signed)
All questions answered.  She will call back for any additional questions or concerns,

## 2016-10-30 ENCOUNTER — Other Ambulatory Visit: Payer: Self-pay | Admitting: Family Medicine

## 2016-10-30 DIAGNOSIS — Z1231 Encounter for screening mammogram for malignant neoplasm of breast: Secondary | ICD-10-CM

## 2016-11-05 ENCOUNTER — Ambulatory Visit
Admission: RE | Admit: 2016-11-05 | Discharge: 2016-11-05 | Disposition: A | Discharge status: Home / Self Care | Payer: BC Managed Care – PPO | Source: Ambulatory Visit | Attending: Family Medicine | Admitting: Family Medicine

## 2016-11-05 DIAGNOSIS — Z1231 Encounter for screening mammogram for malignant neoplasm of breast: Secondary | ICD-10-CM

## 2016-11-07 ENCOUNTER — Other Ambulatory Visit: Payer: Self-pay | Admitting: Family Medicine

## 2016-11-07 DIAGNOSIS — R928 Other abnormal and inconclusive findings on diagnostic imaging of breast: Secondary | ICD-10-CM

## 2016-11-14 ENCOUNTER — Ambulatory Visit
Admission: RE | Admit: 2016-11-14 | Discharge: 2016-11-14 | Disposition: A | Discharge status: Home / Self Care | Payer: BC Managed Care – PPO | Source: Ambulatory Visit | Attending: Family Medicine | Admitting: Family Medicine

## 2016-11-14 DIAGNOSIS — R928 Other abnormal and inconclusive findings on diagnostic imaging of breast: Secondary | ICD-10-CM

## 2018-02-13 ENCOUNTER — Emergency Department (HOSPITAL_COMMUNITY)
Admission: EM | Admit: 2018-02-13 | Discharge: 2018-02-13 | Disposition: A | Discharge status: Home / Self Care | Payer: BC Managed Care – PPO | Attending: Emergency Medicine | Admitting: Emergency Medicine

## 2018-02-13 ENCOUNTER — Emergency Department (HOSPITAL_COMMUNITY): Payer: BC Managed Care – PPO

## 2018-02-13 ENCOUNTER — Encounter (HOSPITAL_COMMUNITY): Payer: Self-pay | Admitting: Internal Medicine

## 2018-02-13 DIAGNOSIS — Z87891 Personal history of nicotine dependence: Secondary | ICD-10-CM | POA: Insufficient documentation

## 2018-02-13 DIAGNOSIS — R079 Chest pain, unspecified: Secondary | ICD-10-CM | POA: Insufficient documentation

## 2018-02-13 DIAGNOSIS — J45909 Unspecified asthma, uncomplicated: Secondary | ICD-10-CM | POA: Diagnosis not present

## 2018-02-13 LAB — BASIC METABOLIC PANEL
Anion gap: 9 (ref 5–15)
BUN: 8 mg/dL (ref 8–23)
CALCIUM: 9.2 mg/dL (ref 8.9–10.3)
CO2: 25 mmol/L (ref 22–32)
CREATININE: 0.81 mg/dL (ref 0.44–1.00)
Chloride: 104 mmol/L (ref 98–111)
GFR calc non Af Amer: >60 mL/min (ref >60)
Glucose, Bld: 100 mg/dL — ABNORMAL HIGH (ref 70–99)
Potassium: 3.7 mmol/L (ref 3.5–5.1)
Sodium: 138 mmol/L (ref 135–145)

## 2018-02-13 LAB — CBC
HCT: 40.8 % (ref 36.0–46.0)
Hemoglobin: 12.9 g/dL (ref 12.0–15.0)
MCH: 25.6 pg — ABNORMAL LOW (ref 26.0–34.0)
MCHC: 31.6 g/dL (ref 30.0–36.0)
MCV: 81.1 fL (ref 80.0–100.0)
Platelets: 268 10*3/uL (ref 150–400)
RBC: 5.03 MIL/uL (ref 3.87–5.11)
RDW: 12.8 % (ref 11.5–15.5)
WBC: 8.2 10*3/uL (ref 4.0–10.5)
nRBC: 0 % (ref 0.0–0.2)

## 2018-02-13 LAB — HEPATIC FUNCTION PANEL
ALT: 24 U/L (ref 0–44)
AST: 26 U/L (ref 15–41)
Albumin: 3.9 g/dL (ref 3.5–5.0)
Alkaline Phosphatase: 84 U/L (ref 38–126)
Bilirubin, Direct: <0.1 mg/dL (ref 0.0–0.2)
Total Bilirubin: 0.8 mg/dL (ref 0.3–1.2)
Total Protein: 8.4 g/dL — ABNORMAL HIGH (ref 6.5–8.1)

## 2018-02-13 LAB — I-STAT TROPONIN, ED: TROPONIN I, POC: 0 ng/mL (ref 0.00–0.08)

## 2018-02-13 LAB — TROPONIN I: Troponin I: <0.03 ng/mL (ref <0.03)

## 2018-02-13 LAB — LIPASE, BLOOD: Lipase: 29 U/L (ref 11–51)

## 2018-02-13 NOTE — ED Notes (Signed)
ED Provider at bedside. 
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
## 2018-02-13 NOTE — ED Triage Notes (Signed)
Pt here via POV from home c/o sudden onset chest pain that started 3-4 days ago. Pt describes pain as intermittent discomfort and rates it 0/10 at this time. VSS at this time. Denies nausea/vomiting/headache.

## 2018-08-17 ENCOUNTER — Other Ambulatory Visit: Payer: Self-pay | Admitting: Family Medicine

## 2018-08-17 DIAGNOSIS — Z1231 Encounter for screening mammogram for malignant neoplasm of breast: Secondary | ICD-10-CM

## 2018-09-21 ENCOUNTER — Encounter: Payer: Self-pay | Admitting: Gastroenterology

## 2018-10-01 ENCOUNTER — Ambulatory Visit
Admission: RE | Admit: 2018-10-01 | Discharge: 2018-10-01 | Disposition: A | Discharge status: Home / Self Care | Payer: BC Managed Care – PPO | Source: Ambulatory Visit | Attending: Family Medicine | Admitting: Family Medicine

## 2018-10-01 ENCOUNTER — Other Ambulatory Visit: Payer: Self-pay

## 2018-10-01 DIAGNOSIS — Z1231 Encounter for screening mammogram for malignant neoplasm of breast: Secondary | ICD-10-CM

## 2018-11-27 ENCOUNTER — Other Ambulatory Visit: Payer: Self-pay

## 2018-11-27 ENCOUNTER — Ambulatory Visit
Admission: RE | Admit: 2018-11-27 | Discharge: 2018-11-27 | Disposition: A | Discharge status: Home / Self Care | Payer: BC Managed Care – PPO | Source: Ambulatory Visit | Attending: Family Medicine | Admitting: Family Medicine

## 2018-11-27 ENCOUNTER — Other Ambulatory Visit: Payer: Self-pay | Admitting: Family Medicine

## 2018-11-27 DIAGNOSIS — M79675 Pain in left toe(s): Secondary | ICD-10-CM

## 2019-03-25 ENCOUNTER — Ambulatory Visit: Payer: BC Managed Care – PPO | Attending: Family

## 2019-03-25 DIAGNOSIS — Z23 Encounter for immunization: Secondary | ICD-10-CM

## 2019-03-25 NOTE — Progress Notes (Signed)
   Covid-19 Vaccination Clinic  Name:  [name]    MRN: [id number] DOB: May 25, 1950  03/25/2019  [name] was observed post Covid-19 immunization for 15 minutes without incident. She was provided with Vaccine Information Sheet and instruction to access the V-Safe system.   [name] was instructed to call 911 with any severe reactions post vaccine: Marland Kitchen Difficulty breathing  . Swelling of face and throat  . A fast heartbeat  . A bad rash all over body  . Dizziness and weakness   Immunizations Administered    Name Date Dose VIS Date Route   Moderna COVID-19 Vaccine 03/25/2019  1:29 PM 0.5 mL 12/15/2018 Intramuscular   Manufacturer: Moderna   Lot: [id number]   [place]:[id number]

## 2019-04-27 ENCOUNTER — Ambulatory Visit: Payer: BC Managed Care – PPO | Attending: Family

## 2019-04-27 DIAGNOSIS — Z23 Encounter for immunization: Secondary | ICD-10-CM

## 2019-04-27 NOTE — Progress Notes (Signed)
   Covid-19 Vaccination Clinic  Name:  Shelia Wade    MRN: [id number] DOB: 08/29/1950  04/27/2019  Shelia Wade was observed post Covid-19 immunization for 15 minutes without incident. She was provided with Vaccine Information Sheet and instruction to access the V-Safe system.   Shelia Wade was instructed to call 911 with any severe reactions post vaccine: Marland Kitchen Difficulty breathing  . Swelling of face and throat  . A fast heartbeat  . A bad rash all over body  . Dizziness and weakness   Immunizations Administered    Name Date Dose VIS Date Route   Moderna COVID-19 Vaccine 04/27/2019  2:38 PM 0.5 mL 12/15/2018 Intramuscular   Manufacturer: Moderna   Lot: [id number]   [id number]

## 2019-06-18 ENCOUNTER — Encounter (HOSPITAL_COMMUNITY): Payer: Self-pay | Admitting: Emergency Medicine

## 2019-06-18 ENCOUNTER — Other Ambulatory Visit: Payer: Self-pay

## 2019-06-18 ENCOUNTER — Emergency Department (HOSPITAL_COMMUNITY)
Admission: EM | Admit: 2019-06-18 | Discharge: 2019-06-19 | Discharge status: Against Medical Advice | Payer: BC Managed Care – PPO | Attending: Emergency Medicine | Admitting: Emergency Medicine

## 2019-06-18 DIAGNOSIS — Y92012 Bathroom of single-family (private) house as the place of occurrence of the external cause: Secondary | ICD-10-CM | POA: Diagnosis not present

## 2019-06-18 DIAGNOSIS — W57XXXA Bitten or stung by nonvenomous insect and other nonvenomous arthropods, initial encounter: Secondary | ICD-10-CM | POA: Insufficient documentation

## 2019-06-18 DIAGNOSIS — Y93E1 Activity, personal bathing and showering: Secondary | ICD-10-CM | POA: Diagnosis not present

## 2019-06-18 DIAGNOSIS — S90861A Insect bite (nonvenomous), right foot, initial encounter: Secondary | ICD-10-CM | POA: Diagnosis present

## 2019-06-18 DIAGNOSIS — Z5321 Procedure and treatment not carried out due to patient leaving prior to being seen by health care provider: Secondary | ICD-10-CM | POA: Diagnosis not present

## 2019-06-18 DIAGNOSIS — Y999 Unspecified external cause status: Secondary | ICD-10-CM | POA: Diagnosis not present

## 2019-06-18 NOTE — ED Triage Notes (Signed)
Pt states she was bite by spider on her right foot while in the shower, pt denies any pain at this time, no swollen or redness noticed,

## 2019-06-19 NOTE — ED Notes (Signed)
Pt did not answer for vs called multiple times

## 2019-11-17 ENCOUNTER — Ambulatory Visit: Payer: BC Managed Care – PPO | Attending: Family

## 2019-11-17 DIAGNOSIS — Z23 Encounter for immunization: Secondary | ICD-10-CM

## 2019-11-29 ENCOUNTER — Other Ambulatory Visit: Payer: Self-pay | Admitting: Family Medicine

## 2019-11-29 DIAGNOSIS — Z1231 Encounter for screening mammogram for malignant neoplasm of breast: Secondary | ICD-10-CM

## 2020-01-12 ENCOUNTER — Ambulatory Visit: Payer: BC Managed Care – PPO

## 2020-01-27 ENCOUNTER — Ambulatory Visit: Payer: Self-pay

## 2020-01-29 NOTE — Progress Notes (Signed)
   Covid-19 Vaccination Clinic  Name:  Shelia Wade    MRN: [id number] DOB: 07/12/1950  01/29/2020  Shelia Wade was observed post Covid-19 immunization for 15 minutes without incident. She was provided with Vaccine Information Sheet and instruction to access the V-Safe system.   Shelia Wade was instructed to call 911 with any severe reactions post vaccine: Marland Kitchen Difficulty breathing  . Swelling of face and throat  . A fast heartbeat  . A bad rash all over body  . Dizziness and weakness   Immunizations Administered    Name Date Dose VIS Date Route   Moderna Covid-19 Booster Vaccine 11/17/2019  6:55 PM 0.25 mL 11/03/2019 Intramuscular   Manufacturer: Moderna   Lot: [id number]   [place]: [id number]

## 2020-02-29 ENCOUNTER — Other Ambulatory Visit: Payer: Self-pay

## 2020-02-29 ENCOUNTER — Ambulatory Visit
Admission: RE | Admit: 2020-02-29 | Discharge: 2020-02-29 | Disposition: A | Discharge status: Home / Self Care | Payer: Medicare PPO | Source: Ambulatory Visit | Attending: Family Medicine | Admitting: Family Medicine

## 2020-02-29 DIAGNOSIS — Z1231 Encounter for screening mammogram for malignant neoplasm of breast: Secondary | ICD-10-CM

## 2020-05-12 ENCOUNTER — Ambulatory Visit: Payer: Medicare PPO | Attending: Family

## 2020-05-12 DIAGNOSIS — Z23 Encounter for immunization: Secondary | ICD-10-CM

## 2020-06-10 NOTE — Progress Notes (Signed)
   Covid-19 Vaccination Clinic  Name:  Shelia Wade    MRN: [id number] DOB: 1950-05-25  06/10/2020  Shelia Wade was observed post Covid-19 immunization for 15 minutes without incident. She was provided with Vaccine Information Sheet and instruction to access the V-Safe system.   Shelia Wade was instructed to call 911 with any severe reactions post vaccine: Marland Kitchen Difficulty breathing  . Swelling of face and throat  . A fast heartbeat  . A bad rash all over body  . Dizziness and weakness   Immunizations Administered    Name Date Dose VIS Date Route   Moderna Covid-19 Booster Vaccine 05/12/2020  8:45 AM 0.25 mL 11/03/2019 Intramuscular   Manufacturer: Moderna   Lot: [id number]   [place]: [id number]

## 2020-11-21 ENCOUNTER — Ambulatory Visit: Payer: Medicare PPO | Attending: Family

## 2020-11-21 DIAGNOSIS — Z23 Encounter for immunization: Secondary | ICD-10-CM

## 2020-11-27 NOTE — Progress Notes (Signed)
   Covid-19 Vaccination Clinic  Name:  Shelia Wade    MRN: [id number] DOB: 04-30-1950  11/27/2020  Shelia Wade was observed post Covid-19 immunization for 15 minutes without incident. She was provided with Vaccine Information Sheet and instruction to access the V-Safe system.   Shelia Wade was instructed to call 911 with any severe reactions post vaccine: Difficulty breathing  Swelling of face and throat  A fast heartbeat  A bad rash all over body  Dizziness and weakness

## 2020-12-29 ENCOUNTER — Other Ambulatory Visit: Payer: Self-pay

## 2020-12-29 ENCOUNTER — Ambulatory Visit (HOSPITAL_COMMUNITY)
Admission: EM | Admit: 2020-12-29 | Discharge: 2020-12-29 | Disposition: A | Discharge status: Home / Self Care | Payer: Medicare PPO | Attending: Emergency Medicine | Admitting: Emergency Medicine

## 2020-12-29 ENCOUNTER — Encounter (HOSPITAL_COMMUNITY): Payer: Self-pay

## 2020-12-29 DIAGNOSIS — S50312A Abrasion of left elbow, initial encounter: Secondary | ICD-10-CM

## 2020-12-29 DIAGNOSIS — M25561 Pain in right knee: Secondary | ICD-10-CM

## 2020-12-29 DIAGNOSIS — M25562 Pain in left knee: Secondary | ICD-10-CM

## 2020-12-29 MED ORDER — MELOXICAM 7.5 MG PO TABS: 7.5000 mg | ORAL_TABLET | Freq: Every day | ORAL | 0 refills | Status: DC

## 2020-12-29 MED ORDER — TRAMADOL HCL 50 MG PO TABS: 50.0000 mg | ORAL_TABLET | Freq: Every evening | ORAL | 0 refills | Status: DC | PRN

## 2020-12-29 MED ORDER — BACITRACIN ZINC 500 UNIT/GM EX OINT
TOPICAL_OINTMENT | CUTANEOUS | Status: AC
  Filled 2020-12-29: qty 0.9

## 2020-12-29 MED ORDER — CYCLOBENZAPRINE HCL 10 MG PO TABS: 10.0000 mg | ORAL_TABLET | Freq: Every day | ORAL | 0 refills | Status: DC
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
## 2020-12-29 NOTE — Discharge Instructions (Addendum)
Your pain is most likely caused by irritation to the muscles or ligaments.   You may take meloxicam once a day to help with pain  You may use muscle relaxer twice a day, be mindful this medication may make you drowsy, if this occurs you may take at nighttime, I recommend you take a dose tonight and she will wake up very stiff   You may take tramadol as needed at bedtime for severe pain, you have been dispensed 5 tablets please use sparingly   You may use heating pad in 15 minute intervals as needed for additional comfort, within the first 2-3 days you may find comfort in using ice in 10-15 minutes over affected area  Begin stretching affected area daily for 10 minutes as tolerated to further loosen muscles   When lying down place pillow underneath and between knees for support   If pain persist after recommended treatment or reoccurs if may be beneficial to follow up with orthopedic specialist for evaluation, this doctor specializes in the bones and can manage your symptoms long-term with options such as but not limited to imaging, medications or physical therapy

## 2021-01-09 ENCOUNTER — Other Ambulatory Visit: Payer: Self-pay | Admitting: Family Medicine

## 2021-01-09 ENCOUNTER — Ambulatory Visit
Admission: RE | Admit: 2021-01-09 | Discharge: 2021-01-09 | Disposition: A | Discharge status: Home / Self Care | Payer: Medicare PPO | Source: Ambulatory Visit | Attending: Family Medicine | Admitting: Family Medicine

## 2021-01-09 ENCOUNTER — Other Ambulatory Visit: Payer: Self-pay

## 2021-01-09 DIAGNOSIS — W19XXXA Unspecified fall, initial encounter: Secondary | ICD-10-CM

## 2021-01-09 DIAGNOSIS — M25461 Effusion, right knee: Secondary | ICD-10-CM

## 2021-01-09 DIAGNOSIS — M25462 Effusion, left knee: Secondary | ICD-10-CM

## 2021-01-30 ENCOUNTER — Other Ambulatory Visit: Payer: Self-pay | Admitting: Family Medicine

## 2021-01-30 DIAGNOSIS — N6320 Unspecified lump in the left breast, unspecified quadrant: Secondary | ICD-10-CM

## 2021-02-06 ENCOUNTER — Encounter: Payer: Self-pay | Admitting: Gastroenterology

## 2021-03-06 ENCOUNTER — Ambulatory Visit
Admission: RE | Admit: 2021-03-06 | Discharge: 2021-03-06 | Disposition: A | Discharge status: Home / Self Care | Payer: Medicare PPO | Source: Ambulatory Visit | Attending: Family Medicine | Admitting: Family Medicine

## 2021-03-06 DIAGNOSIS — N6320 Unspecified lump in the left breast, unspecified quadrant: Secondary | ICD-10-CM

## 2021-03-15 ENCOUNTER — Ambulatory Visit (AMBULATORY_SURGERY_CENTER): Payer: Medicare PPO | Admitting: *Deleted

## 2021-03-15 ENCOUNTER — Other Ambulatory Visit: Payer: Self-pay

## 2021-03-15 VITALS — Ht 62.0 in | Wt 165.0 lb

## 2021-03-15 DIAGNOSIS — Z86010 Personal history of colonic polyps: Secondary | ICD-10-CM

## 2021-03-15 MED ORDER — PLENVU 140 G PO SOLR: 1.0000 | ORAL | 0 refills | Status: DC

## 2021-03-15 NOTE — Progress Notes (Signed)
No egg or soy allergy known to patient  ?No issues known to pt with past sedation with any surgeries or procedures ?Patient denies ever being told they had issues or difficulty with intubation  ?No FH of Malignant Hyperthermia ?Pt is not on diet pills ?Pt is not on  home 02  ?Pt is not on blood thinners  ?Pt denies issues with constipation  ?No A fib or A flutter ?Pt is fully vaccinated  for Covid  ? ?[employer] Medicare coupon  Coupon to pt in PV today , Code to Pharmacy and  NO PA's for preps discussed with pt In PV today  ?Discussed with pt there will be an out-of-pocket cost for prep and that varies from $0 to 70 +  dollars - pt verbalized understanding  ? ?Due to the COVID-19 pandemic we are asking patients to follow certain guidelines in PV and the [place]   ?Pt aware of COVID protocols and [facility] guidelines  ? ?PV completed over the phone. Pt verified name, DOB, address and insurance during PV today.  ?Pt mailed instruction packet with copy of consent form to read and not return, and instructions.  ?Pt encouraged to call with questions or issues.  ?If pt has My chart, procedure instructions sent via My Chart  ? ?

## 2021-03-26 ENCOUNTER — Encounter: Payer: Self-pay | Admitting: Gastroenterology

## 2021-03-29 ENCOUNTER — Other Ambulatory Visit: Payer: Self-pay

## 2021-03-29 ENCOUNTER — Other Ambulatory Visit: Payer: Self-pay | Admitting: Gastroenterology

## 2021-03-29 ENCOUNTER — Encounter: Payer: Self-pay | Admitting: Gastroenterology

## 2021-03-29 ENCOUNTER — Ambulatory Visit (AMBULATORY_SURGERY_CENTER): Payer: Medicare PPO | Admitting: Gastroenterology

## 2021-03-29 VITALS — BP 119/67 | HR 51 | Temp 96.6°F | Resp 12 | Ht 62.0 in | Wt 165.0 lb

## 2021-03-29 DIAGNOSIS — D125 Benign neoplasm of sigmoid colon: Secondary | ICD-10-CM

## 2021-03-29 DIAGNOSIS — D124 Benign neoplasm of descending colon: Secondary | ICD-10-CM | POA: Diagnosis not present

## 2021-03-29 DIAGNOSIS — Z86010 Personal history of colonic polyps: Secondary | ICD-10-CM | POA: Diagnosis not present

## 2021-03-29 MED ORDER — SODIUM CHLORIDE 0.9 % IV SOLN: 500.0000 mL | Freq: Once | INTRAVENOUS | Status: AC

## 2021-03-29 NOTE — Progress Notes (Signed)
Pt's states no medical or surgical changes since previsit or office visit.  Vitals DT 
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
## 2021-03-29 NOTE — Progress Notes (Signed)
Report to PACU, RN, vss, BBS= Clear.  

## 2021-03-29 NOTE — Progress Notes (Signed)
Called to room to assist during endoscopic procedure.  Patient ID and intended procedure confirmed with present staff. Received instructions for my participation in the procedure from the performing physician.  

## 2021-03-29 NOTE — Patient Instructions (Signed)
Discharge instructions given. Handouts on polyps and diverticulosis. Resume previous medications. YOU HAD AN ENDOSCOPIC PROCEDURE TODAY AT THE [facility]:   Refer to the procedure report that was given to you for any specific questions about what was found during the examination.  If the procedure report does not answer your questions, please call your gastroenterologist to clarify.  If you requested that your care partner not be given the details of your procedure findings, then the procedure report has been included in a sealed envelope for you to review at your convenience later.  YOU SHOULD EXPECT: Some feelings of bloating in the abdomen. Passage of more gas than usual.  Walking can help get rid of the air that was put into your GI tract during the procedure and reduce the bloating. If you had a lower endoscopy (such as a colonoscopy or flexible sigmoidoscopy) you may notice spotting of blood in your stool or on the toilet paper. If you underwent a bowel prep for your procedure, you may not have a normal bowel movement for a few days.  Please Note:  You might notice some irritation and congestion in your nose or some drainage.  This is from the oxygen used during your procedure.  There is no need for concern and it should clear up in a day or so.  SYMPTOMS TO REPORT IMMEDIATELY:   Following lower endoscopy (colonoscopy or flexible sigmoidoscopy):  Excessive amounts of blood in the stool  Significant tenderness or worsening of abdominal pains  Swelling of the abdomen that is new, acute  Fever of 100F or higher   For urgent or emergent issues, a gastroenterologist can be reached at any hour by calling [phone]. Do not use MyChart messaging for urgent concerns.    DIET:  We do recommend a small meal at first, but then you may proceed to your regular diet.  Drink plenty of fluids but you should avoid alcoholic beverages for 24 hours.  ACTIVITY:  You should plan to take  it easy for the rest of today and you should NOT DRIVE or use heavy machinery until tomorrow (because of the sedation medicines used during the test).    FOLLOW UP: Our staff will call the number listed on your records 48-72 hours following your procedure to check on you and address any questions or concerns that you may have regarding the information given to you following your procedure. If we do not reach you, we will leave a message.  We will attempt to reach you two times.  During this call, we will ask if you have developed any symptoms of COVID 19. If you develop any symptoms (ie: fever, flu-like symptoms, shortness of breath, cough etc.) before then, please call [phone].  If you test positive for Covid 19 in the 2 weeks post procedure, please call and report this information to us.    If any biopsies were taken you will be contacted by phone or by letter within the next 1-3 weeks.  Please call us at [phone] if you have not heard about the biopsies in 3 weeks.    SIGNATURES/CONFIDENTIALITY: You and/or your care partner have signed paperwork which will be entered into your electronic medical record.  These signatures attest to the fact that that the information above on your After Visit Summary has been reviewed and is understood.  Full responsibility of the confidentiality of this discharge information lies with you and/or your care-partner. 

## 2021-03-29 NOTE — Op Note (Signed)
[facility] ?Patient Name: Shelia Wade-quick ?Procedure Date: 03/29/2021 10:01 AM ?MRN: [id number] ?Endoscopist: Ladene Artist , MD ?Age: 71 ?Referring MD:  ?Date of Birth: Aug 04, 1950 ?Gender: Female ?Account #: [id number] ?Procedure:                Colonoscopy ?Indications:              Surveillance: Personal history of adenomatous  ?                          polyps on last colonoscopy > 5 years ago ?Medicines:                Monitored Anesthesia Care ?Procedure:                Pre-Anesthesia Assessment: ?                          - Prior to the procedure, a History and Physical  ?                          was performed, and patient medications and  ?                          allergies were reviewed. The patient's tolerance of  ?                          previous anesthesia was also reviewed. The risks  ?                          and benefits of the procedure and the sedation  ?                          options and risks were discussed with the patient.  ?                          All questions were answered, and informed consent  ?                          was obtained. Prior Anticoagulants: The patient has  ?                          taken no previous anticoagulant or antiplatelet  ?                          agents. ASA Grade Assessment: II - A patient with  ?                          mild systemic disease. After reviewing the risks  ?                          and benefits, the patient was deemed in  ?                          satisfactory condition to undergo the procedure. ?  After obtaining informed consent, the colonoscope  ?                          was passed under direct vision. Throughout the  ?                          procedure, the patient's blood pressure, pulse, and  ?                          oxygen saturations were monitored continuously. The  ?                          Olympus PCF-H190DL (#[id number]) Colonoscope was  ?                          introduced  through the anus and advanced to the the  ?                          cecum, identified by appendiceal orifice and  ?                          ileocecal valve. The ileocecal valve, appendiceal  ?                          orifice, and rectum were photographed. The quality  ?                          of the bowel preparation was good. The colonoscopy  ?                          was performed without difficulty. The patient  ?                          tolerated the procedure well. ?Scope In: 10:12:33 AM ?Scope Out: 10:29:42 AM ?Scope Withdrawal Time: 0 hours 14 minutes 12 seconds  ?Total Procedure Duration: 0 hours 17 minutes 9 seconds  ?Findings:                 The perianal and digital rectal examinations were  ?                          normal. ?                          Two sessile polyps were found in the sigmoid colon  ?                          and descending colon. The polyps were 6 to 7 mm in  ?                          size. These polyps were removed with a cold snare.  ?                          Resection and retrieval were complete. ?  Multiple small-mouthed diverticula were found in  ?                          the left colon. There was no evidence of  ?                          diverticular bleeding. ?                          The exam was otherwise without abnormality on  ?                          direct and retroflexion views. ?Complications:            No immediate complications. Estimated blood loss:  ?                          None. ?Estimated Blood Loss:     Estimated blood loss: none. ?Impression:               - Two 6 to 7 mm polyps in the sigmoid colon and in  ?                          the descending colon, removed with a cold snare.  ?                          Resected and retrieved. ?                          - Mild left colon diverticulosis. ?                          - The examination was otherwise normal on direct  ?                          and retroflexion  views. ?Recommendation:           - Repeat colonoscopy date to be determined after  ?                          pending pathology results are reviewed for  ?                          surveillance based on pathology results. ?                          - Patient has a contact number available for  ?                          emergencies. The signs and symptoms of potential  ?                          delayed complications were discussed with the  ?                          patient. Return to normal activities tomorrow.  ?  Written discharge instructions were provided to the  ?                          patient. ?                          - High fiber diet. ?                          - Continue present medications. ?                          - Await pathology results. ?Ladene Artist, MD ?03/29/2021 10:32:42 AM ?This report has been signed electronically. ?

## 2021-04-02 ENCOUNTER — Telehealth: Payer: Self-pay

## 2021-04-02 NOTE — Telephone Encounter (Signed)
Left message on answering machine. 

## 2021-04-16 ENCOUNTER — Encounter: Payer: Self-pay | Admitting: Gastroenterology

## 2021-05-30 ENCOUNTER — Other Ambulatory Visit: Payer: Self-pay | Admitting: Family Medicine

## 2021-05-30 ENCOUNTER — Ambulatory Visit
Admission: RE | Admit: 2021-05-30 | Discharge: 2021-05-30 | Disposition: A | Discharge status: Home / Self Care | Payer: Medicare PPO | Source: Ambulatory Visit | Attending: Family Medicine | Admitting: Family Medicine

## 2021-05-30 DIAGNOSIS — R52 Pain, unspecified: Secondary | ICD-10-CM

## 2022-03-29 IMAGING — MG MM DIGITAL SCREENING BILAT W/ TOMO AND CAD
8 series · 8 of 24 positions shown · non-contrast
Comparison: Previous exam(s).

CLINICAL DATA: Screening.

EXAM:
DIGITAL SCREENING BILATERAL MAMMOGRAM WITH TOMOSYNTHESIS AND CAD
TECHNIQUE: Bilateral screening digital craniocaudal and mediolateral oblique
mammograms were obtained. Bilateral screening digital breast
tomosynthesis was performed. The images were evaluated with
computer-aided detection.

[L CC synth-2D]
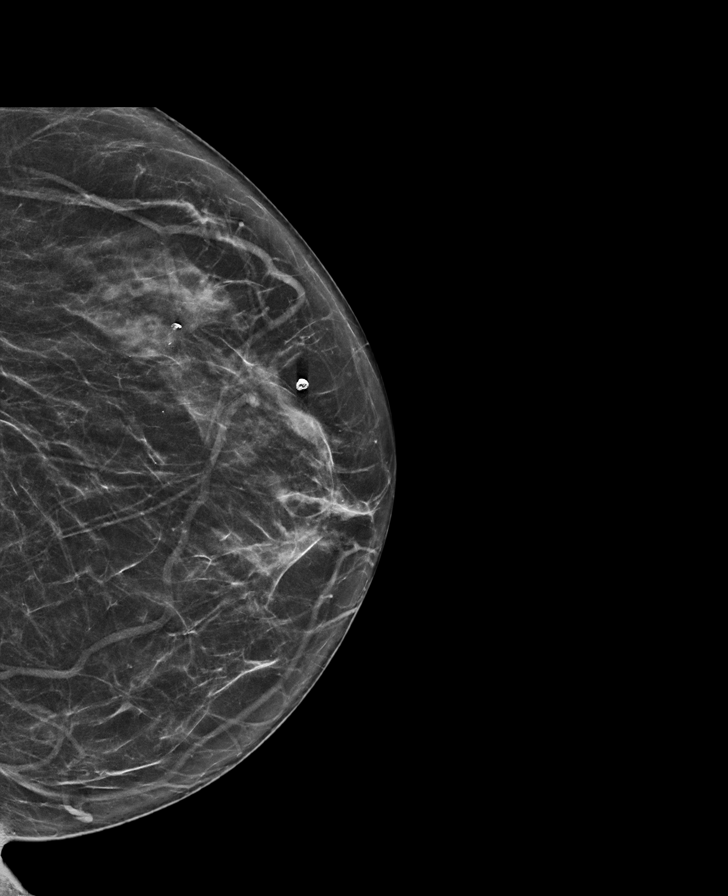

[R CC synth-2D]
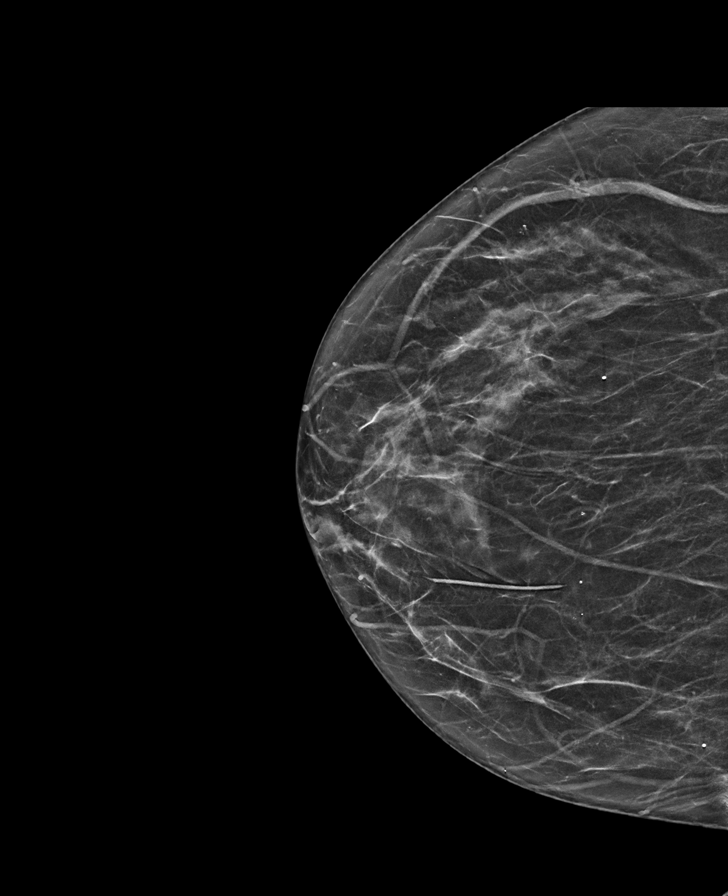

[R MLO synth-2D]
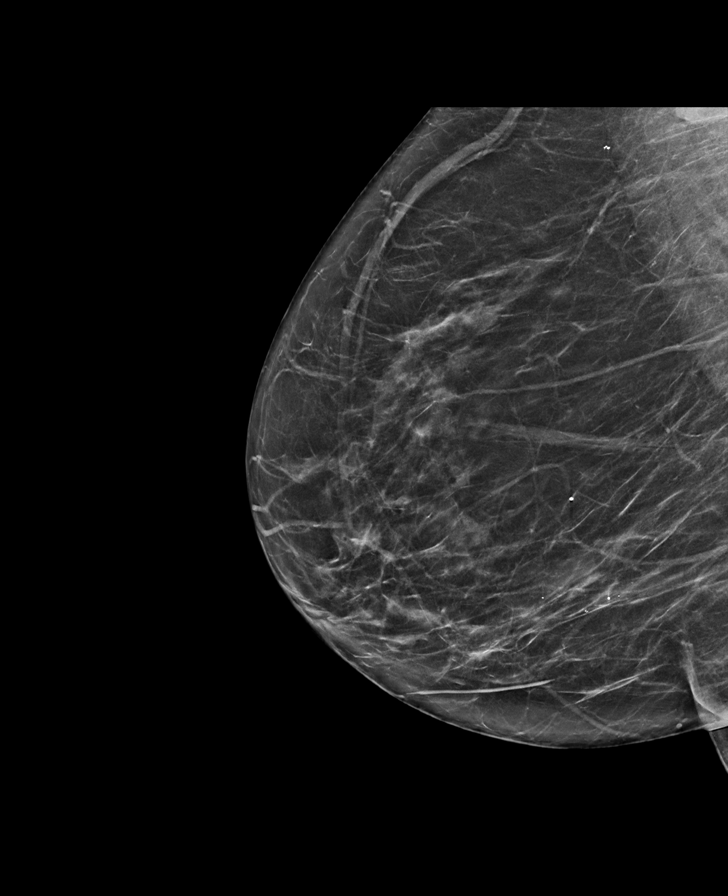

[L MLO synth-2D]
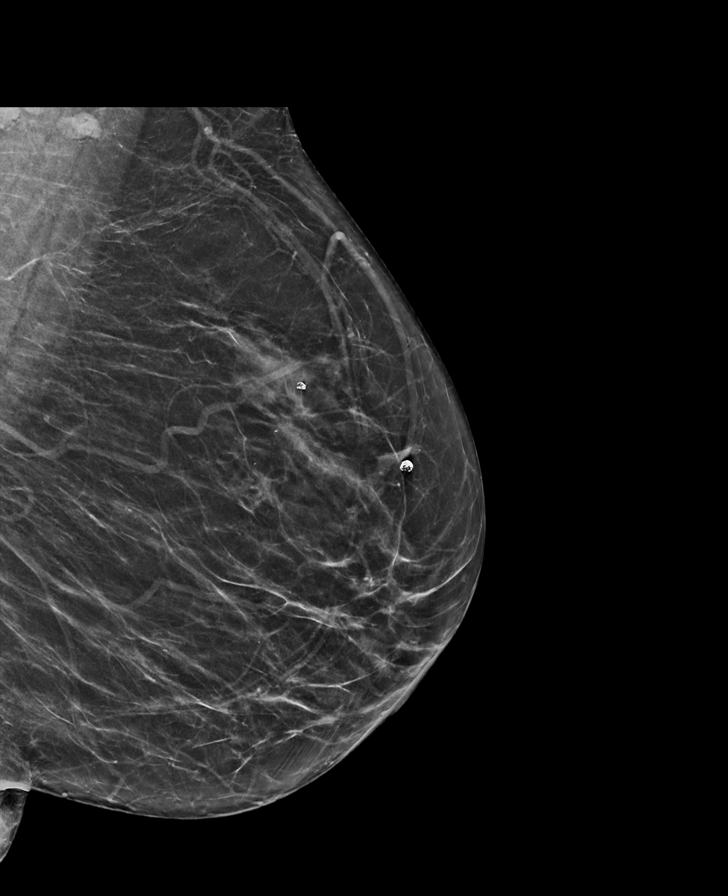

[R MLO tomo · tomo slice 35/69.0]
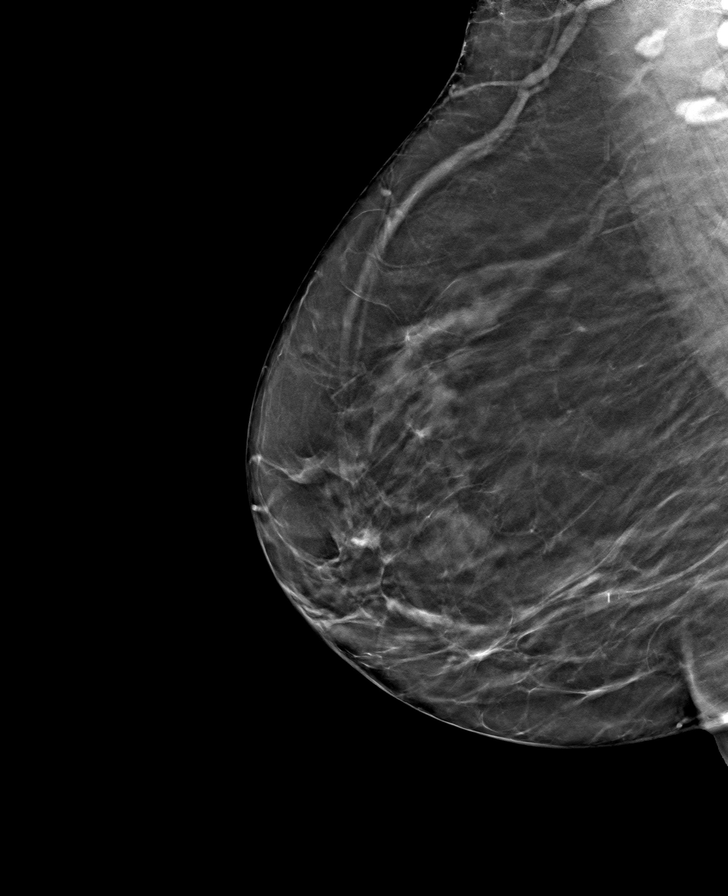

[R CC tomo · tomo slice 31/60.0]
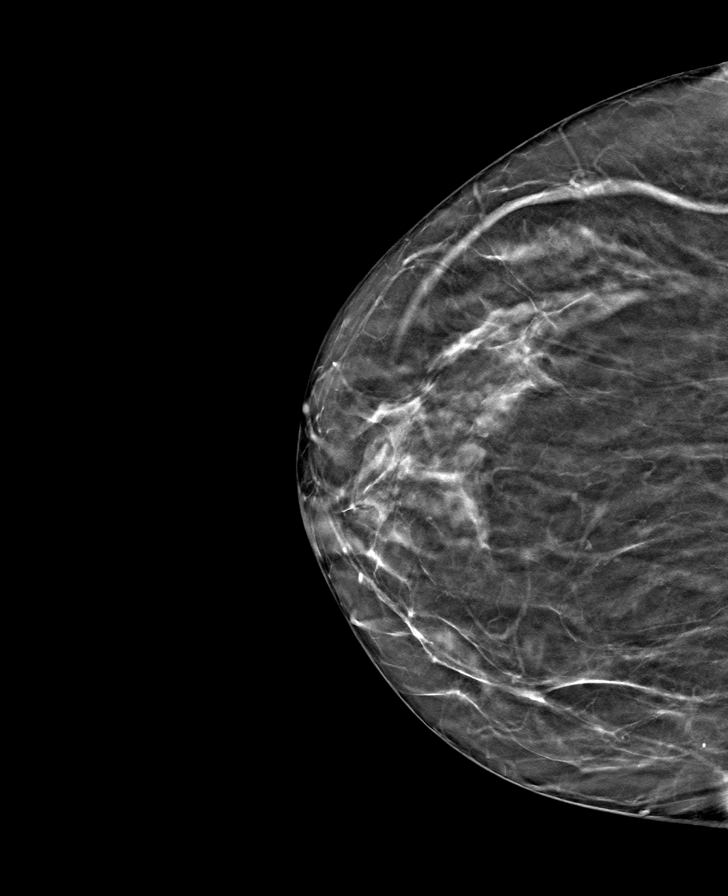

[L CC tomo · tomo slice 33/65.0]
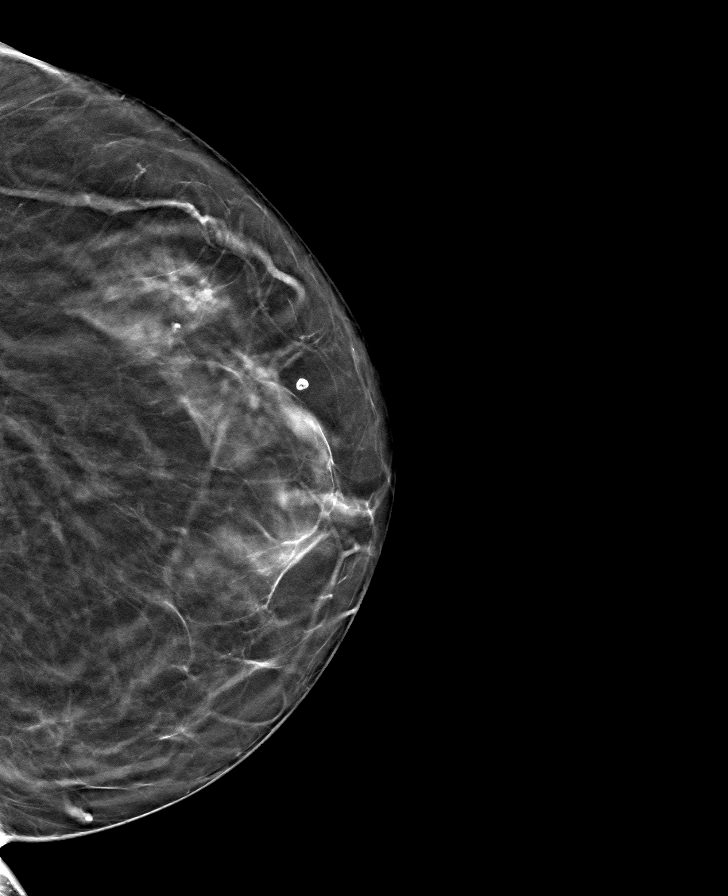

[L MLO tomo · tomo slice 35/68.0]
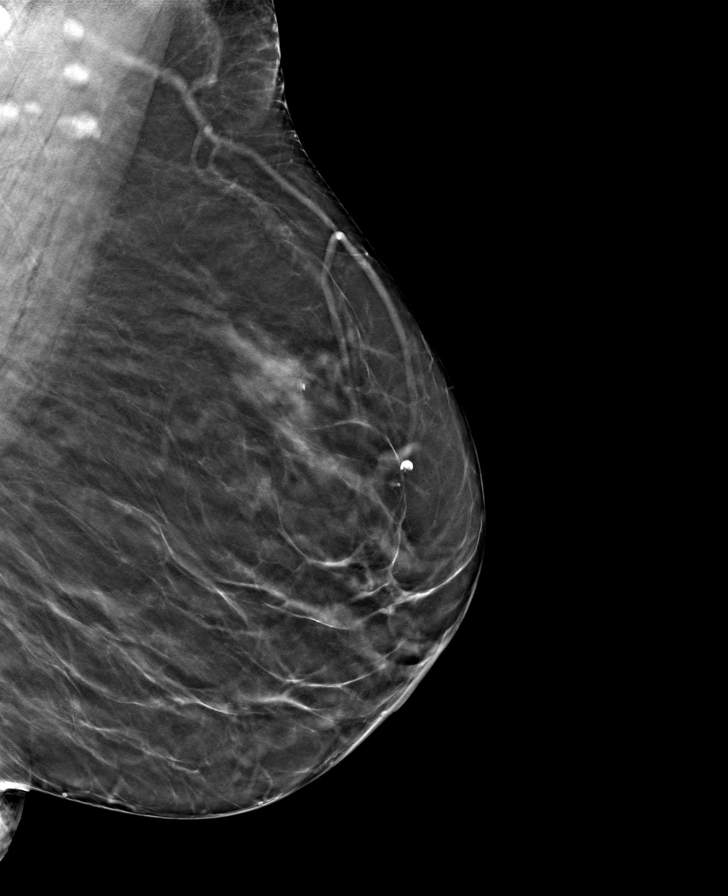

[8 of 24 positions shown; findings below may reference images not displayed]

ACR Breast Density Category b: There are scattered areas of
fibroglandular density.
FINDINGS: There are no findings suspicious for malignancy.
IMPRESSION: No mammographic evidence of malignancy. A result letter of this
screening mammogram will be mailed directly to the patient.

RECOMMENDATION:
Screening mammogram in one year. (Code:51-O-LD2)

BI-RADS CATEGORY  1: Negative.

## 2022-04-02 ENCOUNTER — Ambulatory Visit
Admission: RE | Admit: 2022-04-02 | Discharge: 2022-04-02 | Disposition: A | Discharge status: Home / Self Care | Payer: Medicare PPO | Source: Ambulatory Visit | Attending: Family Medicine | Admitting: Family Medicine

## 2022-04-02 ENCOUNTER — Other Ambulatory Visit: Payer: Self-pay | Admitting: Family Medicine

## 2022-04-02 DIAGNOSIS — R053 Chronic cough: Secondary | ICD-10-CM

## 2022-04-18 ENCOUNTER — Other Ambulatory Visit: Payer: Self-pay | Admitting: Family Medicine

## 2022-04-18 DIAGNOSIS — Z1231 Encounter for screening mammogram for malignant neoplasm of breast: Secondary | ICD-10-CM

## 2022-05-27 ENCOUNTER — Ambulatory Visit
Admission: RE | Admit: 2022-05-27 | Discharge: 2022-05-27 | Disposition: A | Discharge status: Home / Self Care | Payer: Medicare PPO | Source: Ambulatory Visit | Attending: Family Medicine | Admitting: Family Medicine

## 2022-05-27 DIAGNOSIS — Z1231 Encounter for screening mammogram for malignant neoplasm of breast: Secondary | ICD-10-CM

## 2022-05-30 ENCOUNTER — Other Ambulatory Visit: Payer: Self-pay | Admitting: Family Medicine

## 2022-05-30 DIAGNOSIS — R928 Other abnormal and inconclusive findings on diagnostic imaging of breast: Secondary | ICD-10-CM

## 2022-06-04 ENCOUNTER — Ambulatory Visit
Admission: RE | Admit: 2022-06-04 | Discharge: 2022-06-04 | Disposition: A | Discharge status: Home / Self Care | Payer: Medicare PPO | Source: Ambulatory Visit | Attending: Family Medicine | Admitting: Family Medicine

## 2022-06-04 ENCOUNTER — Ambulatory Visit: Admission: RE | Admit: 2022-06-04 | Payer: Medicare PPO | Source: Ambulatory Visit

## 2022-06-04 DIAGNOSIS — R928 Other abnormal and inconclusive findings on diagnostic imaging of breast: Secondary | ICD-10-CM

## 2023-02-27 ENCOUNTER — Ambulatory Visit
Admission: EM | Admit: 2023-02-27 | Discharge: 2023-02-27 | Disposition: A | Discharge status: Home / Self Care | Payer: Medicare PPO | Attending: Family Medicine | Admitting: Family Medicine

## 2023-02-27 DIAGNOSIS — J09X2 Influenza due to identified novel influenza A virus with other respiratory manifestations: Secondary | ICD-10-CM

## 2023-02-27 DIAGNOSIS — R051 Acute cough: Secondary | ICD-10-CM

## 2023-02-27 LAB — POC COVID19/FLU A&B COMBO
Covid Antigen, POC: NEGATIVE
Influenza A Antigen, POC: POSITIVE — AB
Influenza B Antigen, POC: NEGATIVE

## 2023-02-27 NOTE — Discharge Instructions (Signed)
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
## 2023-02-27 NOTE — ED Triage Notes (Signed)
Pt states cough,congestion,body aches and chills for the past 2 days.

## 2023-07-22 ENCOUNTER — Other Ambulatory Visit: Payer: Self-pay | Admitting: Family Medicine

## 2023-07-22 DIAGNOSIS — Z1231 Encounter for screening mammogram for malignant neoplasm of breast: Secondary | ICD-10-CM

## 2023-07-24 ENCOUNTER — Ambulatory Visit
Admission: RE | Admit: 2023-07-24 | Discharge: 2023-07-24 | Disposition: A | Discharge status: Home / Self Care | Source: Ambulatory Visit | Attending: Family Medicine | Admitting: Family Medicine

## 2023-07-24 DIAGNOSIS — Z1231 Encounter for screening mammogram for malignant neoplasm of breast: Secondary | ICD-10-CM

## 2023-07-30 ENCOUNTER — Other Ambulatory Visit: Payer: Self-pay | Admitting: Family Medicine

## 2023-07-30 DIAGNOSIS — R928 Other abnormal and inconclusive findings on diagnostic imaging of breast: Secondary | ICD-10-CM

## 2023-08-07 ENCOUNTER — Ambulatory Visit
Admission: RE | Admit: 2023-08-07 | Discharge: 2023-08-07 | Disposition: A | Discharge status: Home / Self Care | Source: Ambulatory Visit | Attending: Family Medicine | Admitting: Family Medicine

## 2023-08-07 DIAGNOSIS — R928 Other abnormal and inconclusive findings on diagnostic imaging of breast: Secondary | ICD-10-CM

## 2023-08-08 ENCOUNTER — Other Ambulatory Visit: Payer: Self-pay | Admitting: Family Medicine

## 2023-08-08 DIAGNOSIS — N6310 Unspecified lump in the right breast, unspecified quadrant: Secondary | ICD-10-CM

## 2024-02-09 ENCOUNTER — Other Ambulatory Visit

## 2024-02-11 ENCOUNTER — Other Ambulatory Visit: Payer: Self-pay | Admitting: Family Medicine

## 2024-02-11 ENCOUNTER — Inpatient Hospital Stay: Admission: RE | Admit: 2024-02-11 | Discharge: 2024-02-11 | Attending: Family Medicine | Admitting: Family Medicine

## 2024-02-11 DIAGNOSIS — N6310 Unspecified lump in the right breast, unspecified quadrant: Secondary | ICD-10-CM

## 2024-02-11 DIAGNOSIS — R928 Other abnormal and inconclusive findings on diagnostic imaging of breast: Secondary | ICD-10-CM

## 2024-08-17 ENCOUNTER — Other Ambulatory Visit
